# Patient Record
Sex: Female | Born: 1955 | Race: White | Hispanic: No | Marital: Single | Smoking: Current every day smoker
Health system: Southern US, Community
[De-identification: ages and names within clinical notes are randomized; demographics above are authoritative.]

## PROBLEM LIST (undated history)

## (undated) DIAGNOSIS — Z972 Presence of dental prosthetic device (complete) (partial): Secondary | ICD-10-CM

## (undated) DIAGNOSIS — I10 Essential (primary) hypertension: Secondary | ICD-10-CM

## (undated) DIAGNOSIS — M5430 Sciatica, unspecified side: Secondary | ICD-10-CM

## (undated) DIAGNOSIS — G8929 Other chronic pain: Secondary | ICD-10-CM

## (undated) DIAGNOSIS — F410 Panic disorder [episodic paroxysmal anxiety] without agoraphobia: Secondary | ICD-10-CM

## (undated) DIAGNOSIS — F32A Depression, unspecified: Secondary | ICD-10-CM

## (undated) DIAGNOSIS — M1990 Unspecified osteoarthritis, unspecified site: Secondary | ICD-10-CM

## (undated) DIAGNOSIS — J189 Pneumonia, unspecified organism: Secondary | ICD-10-CM

## (undated) DIAGNOSIS — F329 Major depressive disorder, single episode, unspecified: Secondary | ICD-10-CM

## (undated) DIAGNOSIS — G43909 Migraine, unspecified, not intractable, without status migrainosus: Secondary | ICD-10-CM

## (undated) HISTORY — PX: BACK SURGERY: SHX140

## (undated) HISTORY — PX: BLADDER REPAIR: SHX76

## (undated) HISTORY — PX: COLONOSCOPY: SHX174

## (undated) HISTORY — PX: ABDOMINAL HYSTERECTOMY: SHX81

---

## 1998-06-03 ENCOUNTER — Encounter: Admission: RE | Admit: 1998-06-03 | Discharge: 1998-06-03 | Payer: Self-pay | Admitting: Family Medicine

## 1998-06-24 ENCOUNTER — Encounter: Admission: RE | Admit: 1998-06-24 | Discharge: 1998-06-24 | Payer: Self-pay | Admitting: Family Medicine

## 1998-07-06 ENCOUNTER — Encounter: Admission: RE | Admit: 1998-07-06 | Discharge: 1998-07-06 | Payer: Self-pay | Admitting: Sports Medicine

## 1999-05-25 ENCOUNTER — Encounter: Admission: RE | Admit: 1999-05-25 | Discharge: 1999-08-23 | Payer: Self-pay | Admitting: Neurosurgery

## 2000-01-26 ENCOUNTER — Ambulatory Visit (HOSPITAL_COMMUNITY): Admission: RE | Admit: 2000-01-26 | Discharge: 2000-01-26 | Payer: Self-pay | Admitting: Neurosurgery

## 2000-01-26 ENCOUNTER — Encounter: Payer: Self-pay | Admitting: Neurosurgery

## 2000-02-10 ENCOUNTER — Ambulatory Visit (HOSPITAL_COMMUNITY): Admission: RE | Admit: 2000-02-10 | Discharge: 2000-02-10 | Payer: Self-pay | Admitting: Neurosurgery

## 2000-02-10 ENCOUNTER — Encounter: Payer: Self-pay | Admitting: Neurosurgery

## 2000-06-04 ENCOUNTER — Encounter: Admission: RE | Admit: 2000-06-04 | Discharge: 2000-06-04 | Payer: Self-pay | Admitting: Family Medicine

## 2000-06-20 ENCOUNTER — Encounter (INDEPENDENT_AMBULATORY_CARE_PROVIDER_SITE_OTHER): Payer: Self-pay | Admitting: *Deleted

## 2000-06-28 ENCOUNTER — Encounter: Admission: RE | Admit: 2000-06-28 | Discharge: 2000-06-28 | Payer: Self-pay | Admitting: Family Medicine

## 2000-06-29 ENCOUNTER — Encounter: Payer: Self-pay | Admitting: Sports Medicine

## 2000-06-29 ENCOUNTER — Encounter: Admission: RE | Admit: 2000-06-29 | Discharge: 2000-06-29 | Payer: Self-pay | Admitting: *Deleted

## 2000-07-09 ENCOUNTER — Encounter: Admission: RE | Admit: 2000-07-09 | Discharge: 2000-07-09 | Payer: Self-pay | Admitting: Family Medicine

## 2001-09-26 ENCOUNTER — Encounter: Admission: RE | Admit: 2001-09-26 | Discharge: 2001-09-26 | Payer: Self-pay | Admitting: Family Medicine

## 2001-11-05 ENCOUNTER — Encounter: Admission: RE | Admit: 2001-11-05 | Discharge: 2001-11-05 | Payer: Self-pay | Admitting: Family Medicine

## 2002-01-13 ENCOUNTER — Encounter: Admission: RE | Admit: 2002-01-13 | Discharge: 2002-01-13 | Payer: Self-pay | Admitting: Family Medicine

## 2002-03-10 ENCOUNTER — Encounter: Admission: RE | Admit: 2002-03-10 | Discharge: 2002-03-10 | Payer: Self-pay | Admitting: Family Medicine

## 2002-04-28 ENCOUNTER — Encounter: Admission: RE | Admit: 2002-04-28 | Discharge: 2002-04-28 | Payer: Self-pay | Admitting: Family Medicine

## 2005-05-01 ENCOUNTER — Emergency Department (HOSPITAL_COMMUNITY): Admission: EM | Admit: 2005-05-01 | Discharge: 2005-05-01 | Payer: Self-pay | Admitting: Emergency Medicine

## 2005-05-05 ENCOUNTER — Emergency Department (HOSPITAL_COMMUNITY): Admission: EM | Admit: 2005-05-05 | Discharge: 2005-05-05 | Payer: Self-pay | Admitting: Emergency Medicine

## 2006-01-03 ENCOUNTER — Emergency Department (HOSPITAL_COMMUNITY): Admission: EM | Admit: 2006-01-03 | Discharge: 2006-01-03 | Payer: Self-pay | Admitting: Emergency Medicine

## 2007-01-18 ENCOUNTER — Encounter (INDEPENDENT_AMBULATORY_CARE_PROVIDER_SITE_OTHER): Payer: Self-pay | Admitting: *Deleted

## 2007-04-17 ENCOUNTER — Emergency Department (HOSPITAL_COMMUNITY): Admission: EM | Admit: 2007-04-17 | Discharge: 2007-04-17 | Payer: Self-pay | Admitting: Emergency Medicine

## 2007-05-03 ENCOUNTER — Emergency Department (HOSPITAL_COMMUNITY): Admission: EM | Admit: 2007-05-03 | Discharge: 2007-05-03 | Payer: Self-pay | Admitting: *Deleted

## 2007-05-16 ENCOUNTER — Ambulatory Visit: Payer: Self-pay | Admitting: Family Medicine

## 2007-05-28 ENCOUNTER — Ambulatory Visit (HOSPITAL_COMMUNITY): Admission: RE | Admit: 2007-05-28 | Discharge: 2007-05-28 | Payer: Self-pay | Admitting: Family Medicine

## 2007-05-29 ENCOUNTER — Ambulatory Visit: Payer: Self-pay | Admitting: *Deleted

## 2007-07-04 ENCOUNTER — Emergency Department (HOSPITAL_COMMUNITY): Admission: EM | Admit: 2007-07-04 | Discharge: 2007-07-04 | Payer: Self-pay | Admitting: Emergency Medicine

## 2007-07-04 ENCOUNTER — Encounter (INDEPENDENT_AMBULATORY_CARE_PROVIDER_SITE_OTHER): Payer: Self-pay | Admitting: Nurse Practitioner

## 2007-08-02 ENCOUNTER — Emergency Department (HOSPITAL_COMMUNITY): Admission: EM | Admit: 2007-08-02 | Discharge: 2007-08-02 | Payer: Self-pay | Admitting: Emergency Medicine

## 2007-08-02 DIAGNOSIS — I10 Essential (primary) hypertension: Secondary | ICD-10-CM | POA: Insufficient documentation

## 2007-08-02 DIAGNOSIS — F17200 Nicotine dependence, unspecified, uncomplicated: Secondary | ICD-10-CM

## 2007-08-02 DIAGNOSIS — R209 Unspecified disturbances of skin sensation: Secondary | ICD-10-CM

## 2007-08-05 ENCOUNTER — Ambulatory Visit: Payer: Self-pay | Admitting: Internal Medicine

## 2007-08-05 DIAGNOSIS — M546 Pain in thoracic spine: Secondary | ICD-10-CM | POA: Insufficient documentation

## 2007-08-07 ENCOUNTER — Emergency Department (HOSPITAL_COMMUNITY): Admission: EM | Admit: 2007-08-07 | Discharge: 2007-08-07 | Payer: Self-pay | Admitting: Emergency Medicine

## 2007-08-08 ENCOUNTER — Ambulatory Visit (HOSPITAL_COMMUNITY): Admission: RE | Admit: 2007-08-08 | Discharge: 2007-08-08 | Payer: Self-pay | Admitting: Internal Medicine

## 2007-08-12 ENCOUNTER — Telehealth (INDEPENDENT_AMBULATORY_CARE_PROVIDER_SITE_OTHER): Payer: Self-pay | Admitting: Nurse Practitioner

## 2007-08-15 ENCOUNTER — Emergency Department (HOSPITAL_COMMUNITY): Admission: EM | Admit: 2007-08-15 | Discharge: 2007-08-15 | Payer: Self-pay | Admitting: Emergency Medicine

## 2007-08-19 ENCOUNTER — Ambulatory Visit: Payer: Self-pay | Admitting: Nurse Practitioner

## 2007-08-19 DIAGNOSIS — M7130 Other bursal cyst, unspecified site: Secondary | ICD-10-CM | POA: Insufficient documentation

## 2007-08-19 DIAGNOSIS — M5106 Intervertebral disc disorders with myelopathy, lumbar region: Secondary | ICD-10-CM

## 2007-09-03 ENCOUNTER — Emergency Department (HOSPITAL_COMMUNITY): Admission: EM | Admit: 2007-09-03 | Discharge: 2007-09-03 | Payer: Self-pay | Admitting: Emergency Medicine

## 2007-09-16 ENCOUNTER — Ambulatory Visit: Payer: Self-pay | Admitting: Nurse Practitioner

## 2007-09-16 DIAGNOSIS — K029 Dental caries, unspecified: Secondary | ICD-10-CM | POA: Insufficient documentation

## 2007-09-17 ENCOUNTER — Encounter (INDEPENDENT_AMBULATORY_CARE_PROVIDER_SITE_OTHER): Payer: Self-pay | Admitting: Nurse Practitioner

## 2007-09-30 ENCOUNTER — Telehealth (INDEPENDENT_AMBULATORY_CARE_PROVIDER_SITE_OTHER): Payer: Self-pay | Admitting: Nurse Practitioner

## 2007-10-02 ENCOUNTER — Encounter (INDEPENDENT_AMBULATORY_CARE_PROVIDER_SITE_OTHER): Payer: Self-pay | Admitting: Nurse Practitioner

## 2007-10-04 ENCOUNTER — Encounter (INDEPENDENT_AMBULATORY_CARE_PROVIDER_SITE_OTHER): Payer: Self-pay | Admitting: Nurse Practitioner

## 2007-10-24 ENCOUNTER — Telehealth (INDEPENDENT_AMBULATORY_CARE_PROVIDER_SITE_OTHER): Payer: Self-pay | Admitting: Nurse Practitioner

## 2007-11-22 ENCOUNTER — Telehealth (INDEPENDENT_AMBULATORY_CARE_PROVIDER_SITE_OTHER): Payer: Self-pay | Admitting: Nurse Practitioner

## 2007-12-08 ENCOUNTER — Emergency Department (HOSPITAL_COMMUNITY): Admission: EM | Admit: 2007-12-08 | Discharge: 2007-12-08 | Payer: Self-pay | Admitting: Emergency Medicine

## 2007-12-17 ENCOUNTER — Emergency Department (HOSPITAL_COMMUNITY): Admission: EM | Admit: 2007-12-17 | Discharge: 2007-12-17 | Payer: Self-pay | Admitting: Emergency Medicine

## 2007-12-25 ENCOUNTER — Telehealth (INDEPENDENT_AMBULATORY_CARE_PROVIDER_SITE_OTHER): Payer: Self-pay | Admitting: Nurse Practitioner

## 2008-01-23 ENCOUNTER — Ambulatory Visit: Payer: Self-pay | Admitting: Nurse Practitioner

## 2008-02-14 ENCOUNTER — Telehealth (INDEPENDENT_AMBULATORY_CARE_PROVIDER_SITE_OTHER): Payer: Self-pay | Admitting: Nurse Practitioner

## 2008-03-16 ENCOUNTER — Telehealth (INDEPENDENT_AMBULATORY_CARE_PROVIDER_SITE_OTHER): Payer: Self-pay | Admitting: Nurse Practitioner

## 2008-04-08 ENCOUNTER — Encounter (INDEPENDENT_AMBULATORY_CARE_PROVIDER_SITE_OTHER): Payer: Self-pay | Admitting: Nurse Practitioner

## 2008-04-14 ENCOUNTER — Telehealth (INDEPENDENT_AMBULATORY_CARE_PROVIDER_SITE_OTHER): Payer: Self-pay | Admitting: Nurse Practitioner

## 2008-05-14 ENCOUNTER — Telehealth (INDEPENDENT_AMBULATORY_CARE_PROVIDER_SITE_OTHER): Payer: Self-pay | Admitting: Nurse Practitioner

## 2008-06-01 ENCOUNTER — Emergency Department (HOSPITAL_COMMUNITY): Admission: EM | Admit: 2008-06-01 | Discharge: 2008-06-01 | Payer: Self-pay | Admitting: Emergency Medicine

## 2008-06-09 ENCOUNTER — Emergency Department (HOSPITAL_COMMUNITY): Admission: EM | Admit: 2008-06-09 | Discharge: 2008-06-09 | Payer: Self-pay | Admitting: Emergency Medicine

## 2008-06-11 ENCOUNTER — Telehealth (INDEPENDENT_AMBULATORY_CARE_PROVIDER_SITE_OTHER): Payer: Self-pay | Admitting: Nurse Practitioner

## 2008-06-18 ENCOUNTER — Ambulatory Visit (HOSPITAL_COMMUNITY): Admission: RE | Admit: 2008-06-18 | Discharge: 2008-06-19 | Payer: Self-pay | Admitting: Neurosurgery

## 2008-06-18 DIAGNOSIS — Z9889 Other specified postprocedural states: Secondary | ICD-10-CM

## 2008-07-18 ENCOUNTER — Encounter: Admission: RE | Admit: 2008-07-18 | Discharge: 2008-07-18 | Payer: Self-pay | Admitting: Neurosurgery

## 2008-07-25 ENCOUNTER — Emergency Department (HOSPITAL_COMMUNITY): Admission: EM | Admit: 2008-07-25 | Discharge: 2008-07-25 | Payer: Self-pay | Admitting: Emergency Medicine

## 2008-10-14 ENCOUNTER — Emergency Department (HOSPITAL_COMMUNITY): Admission: EM | Admit: 2008-10-14 | Discharge: 2008-10-14 | Payer: Self-pay | Admitting: Emergency Medicine

## 2008-11-16 IMAGING — CR DG FOOT COMPLETE 3+V*L*
3 series · 3 of 3 positions shown · non-contrast
Comparison: None

CLINICAL DATA: Fall with left foot pain.

LEFT FOOT - COMPLETE 3+ VIEW

[t foot ap left]
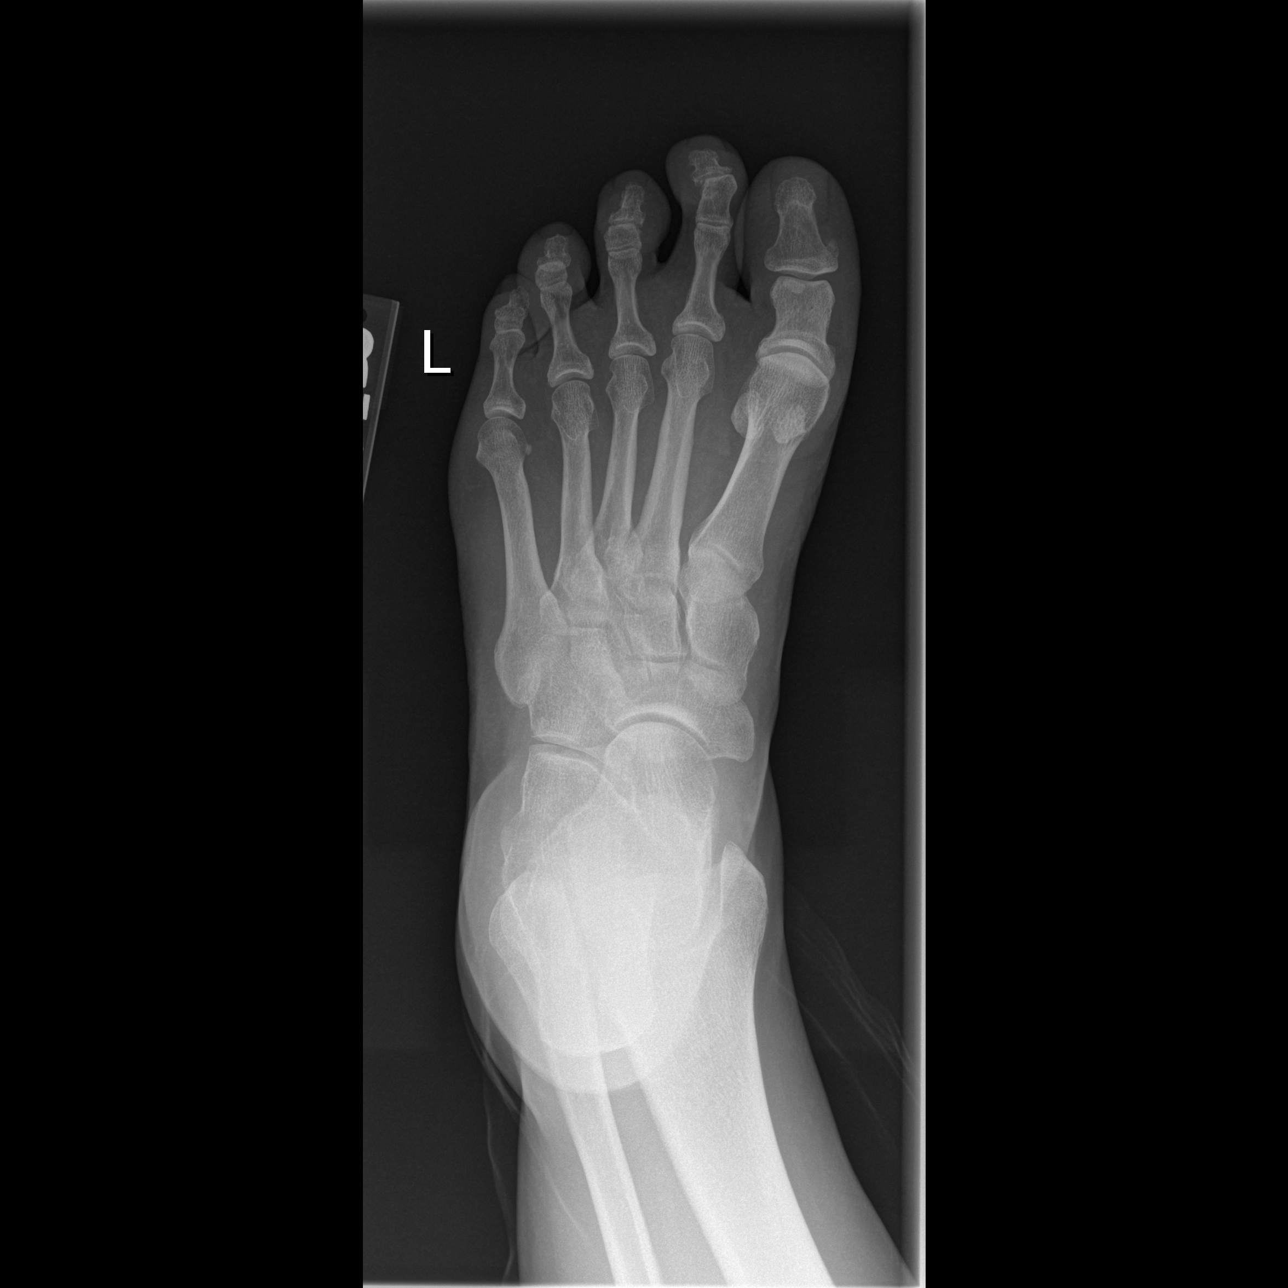

[t foot oblique left]
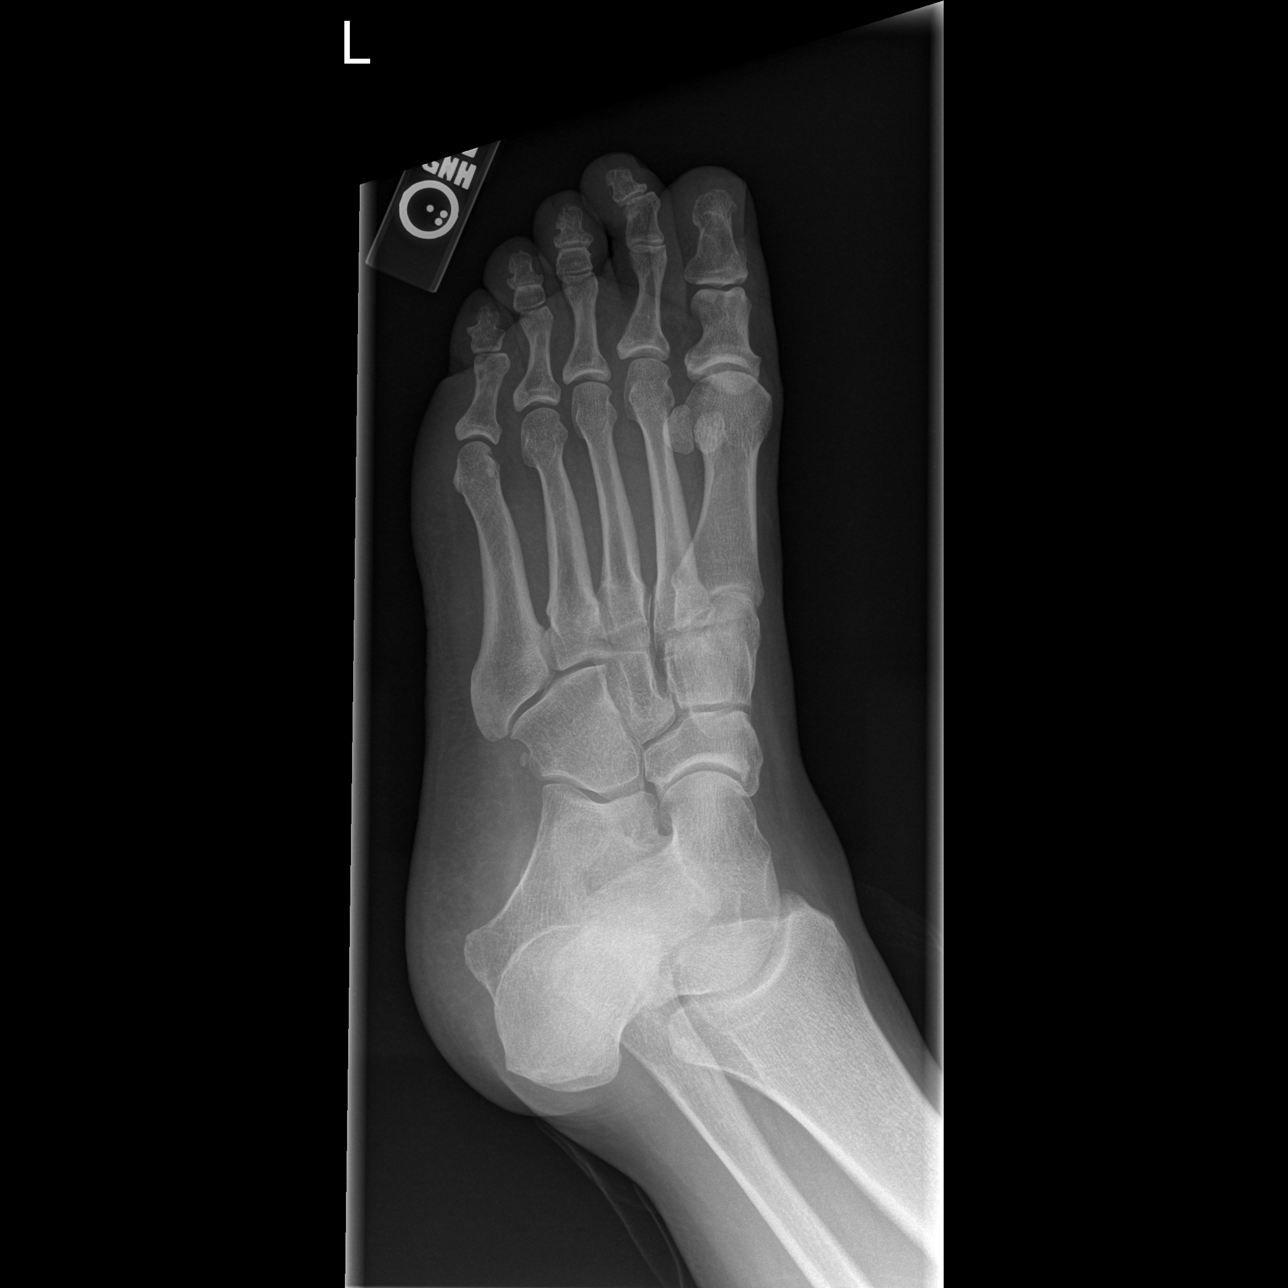

[t foot lat left]
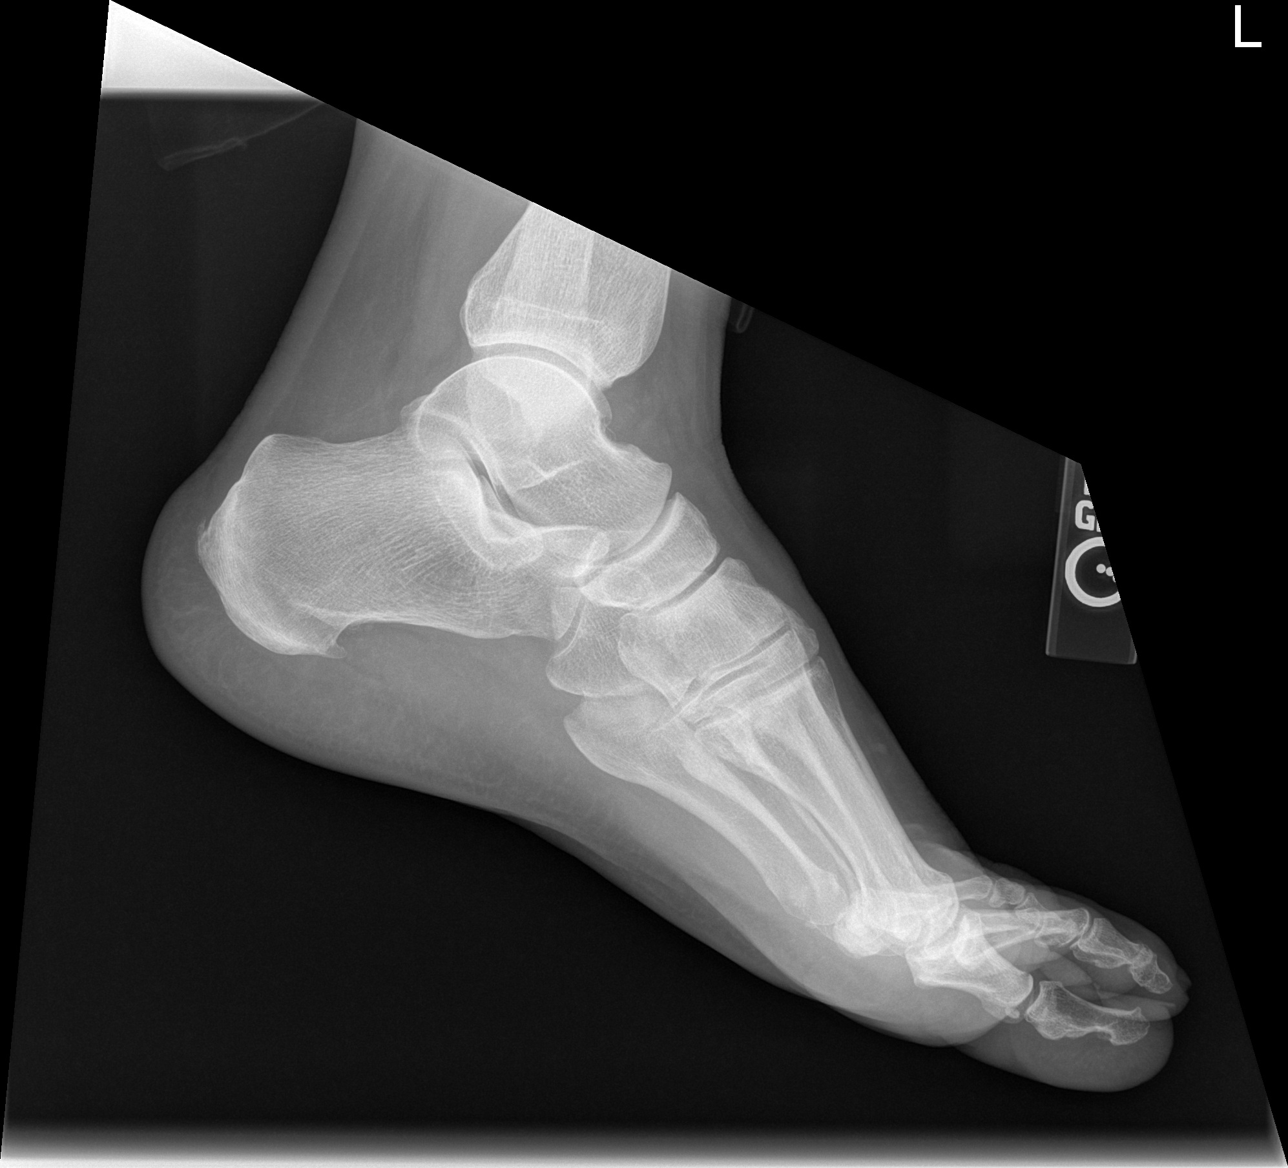

[3 of 3 positions shown; findings below may reference images not displayed]

FINDINGS: There is no evidence of acute bony abnormality.
No evidence of fracture, subluxation, or dislocation identified.
No focal bony lesions are noted.
A small calcaneal spur is present.
IMPRESSION: No evidence of acute bony abnormality.

## 2008-11-16 IMAGING — CR DG SCAPULA*R*
2 series · 2 of 2 positions shown · non-contrast
Comparison: 06/16/2008 chest x-ray

CLINICAL DATA: Fall with right shoulder pain.

RIGHT SCAPULA - two views

[w scapula ap/pa right *]
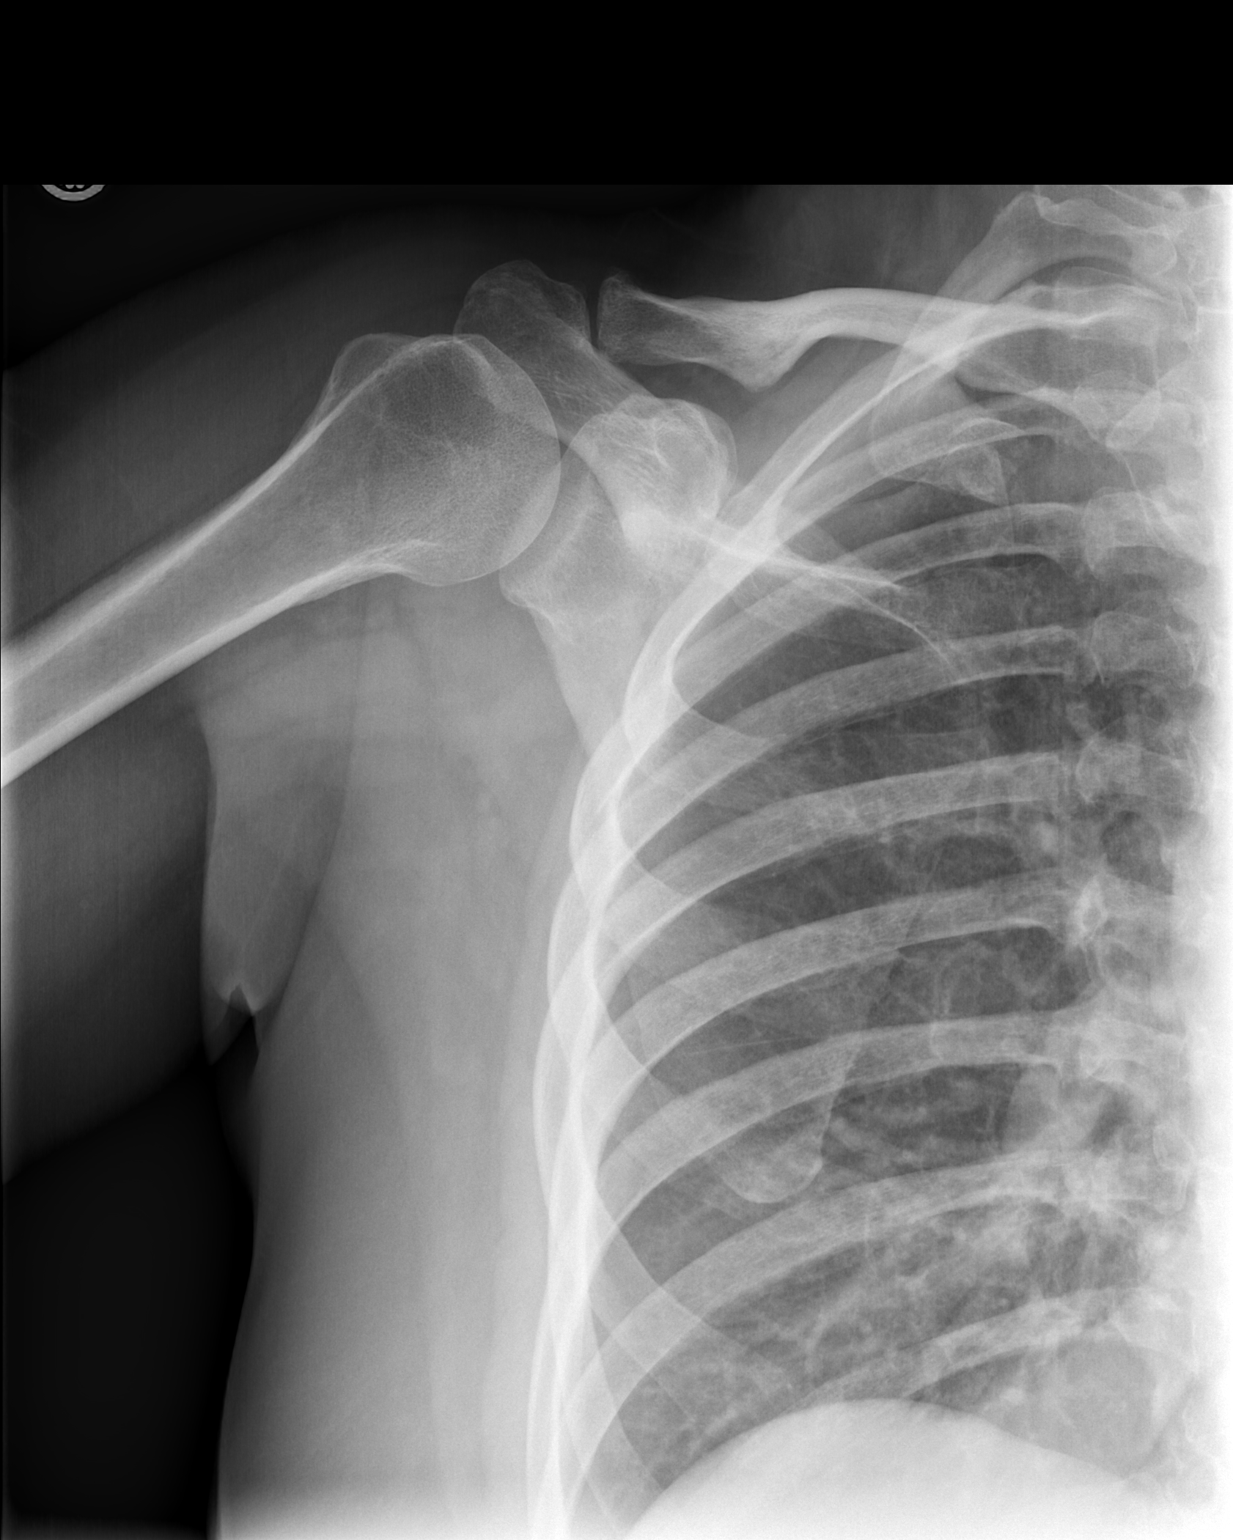

[w scapula lat right *]
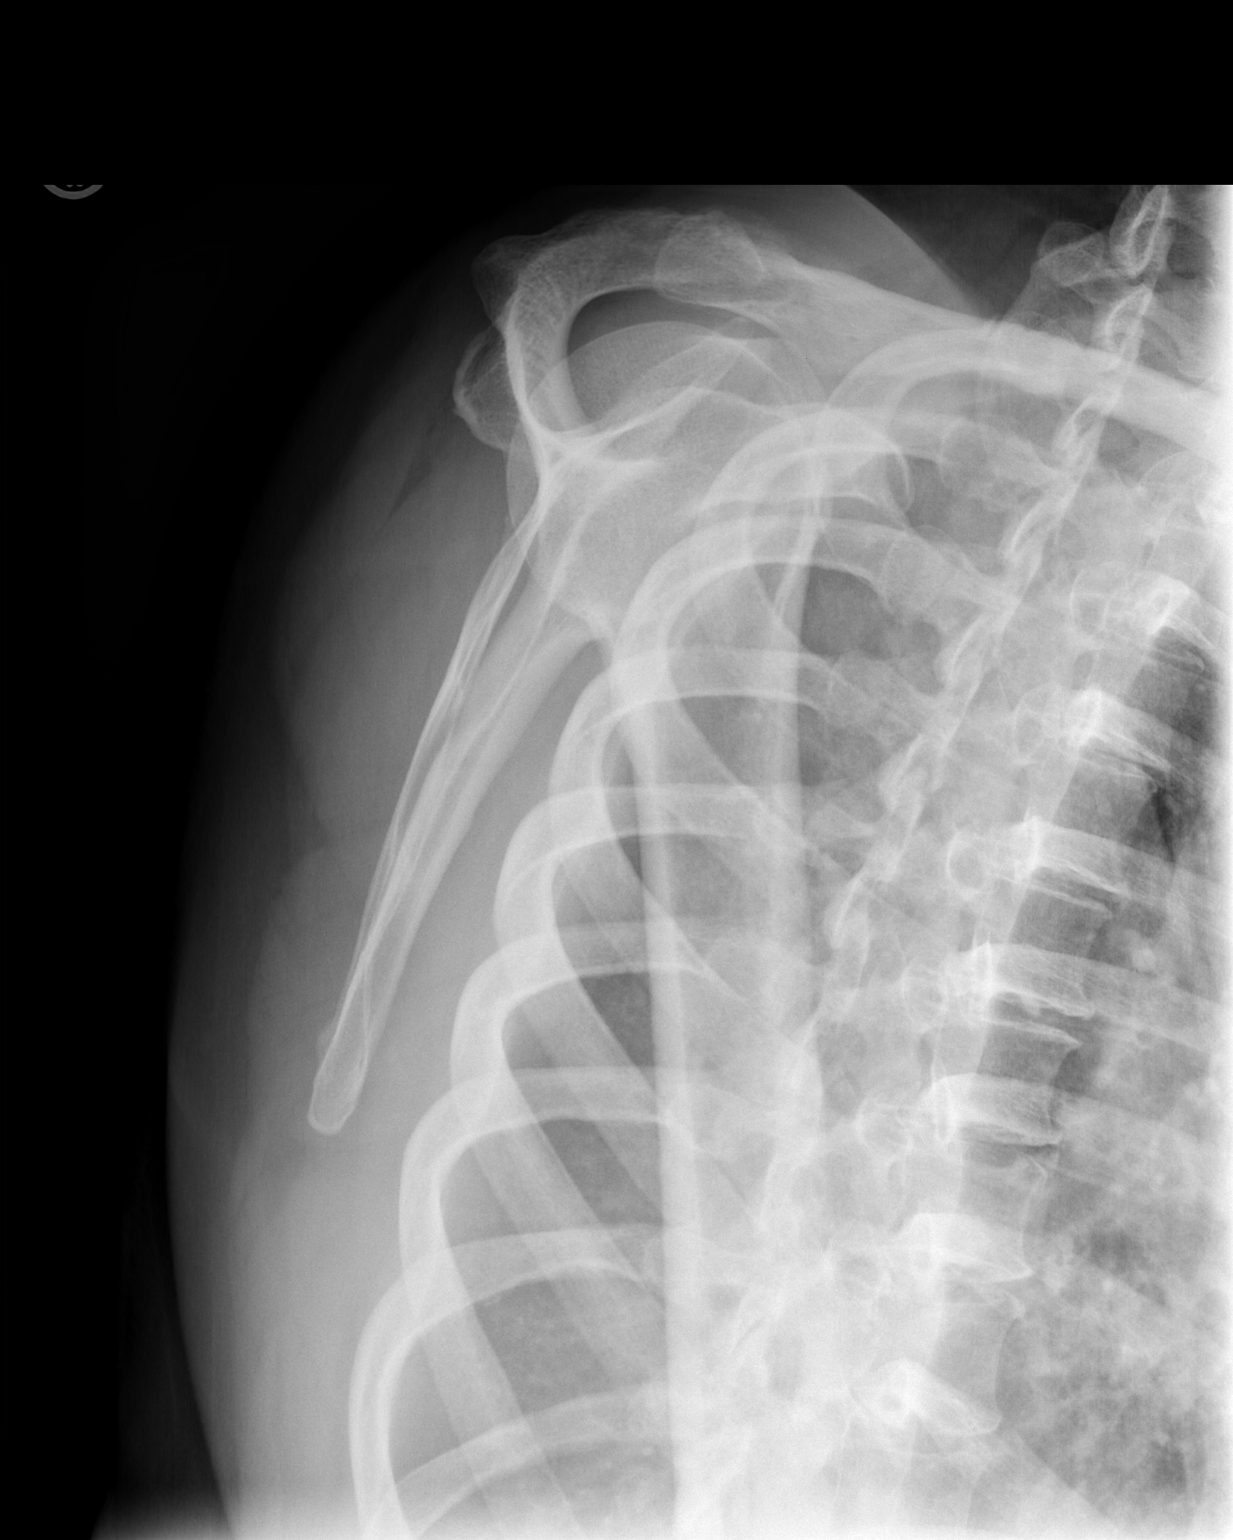

[2 of 2 positions shown; findings below may reference images not displayed]

FINDINGS: No evidence of acute fracture, subluxation or dislocation
identified.

No radio-opaque foreign bodies are present.

No focal bony lesions are noted.
IMPRESSION: No acute bony abnormalities.

## 2008-12-17 ENCOUNTER — Ambulatory Visit: Payer: Self-pay | Admitting: Nurse Practitioner

## 2008-12-21 ENCOUNTER — Telehealth (INDEPENDENT_AMBULATORY_CARE_PROVIDER_SITE_OTHER): Payer: Self-pay | Admitting: Nurse Practitioner

## 2008-12-21 LAB — CONVERTED CEMR LAB
Albumin: 4.6 g/dL (ref 3.5–5.2)
BUN: 11 mg/dL (ref 6–23)
CO2: 27 meq/L (ref 19–32)
Calcium: 9.7 mg/dL (ref 8.4–10.5)
Chloride: 106 meq/L (ref 96–112)
Eosinophils Absolute: 0.3 10*3/uL (ref 0.0–0.7)
Glucose, Bld: 94 mg/dL (ref 70–99)
Lymphocytes Relative: 40 % (ref 12–46)
Lymphs Abs: 2.4 10*3/uL (ref 0.7–4.0)
MCV: 99.8 fL (ref 78.0–100.0)
Monocytes Relative: 7 % (ref 3–12)
Neutro Abs: 2.9 10*3/uL (ref 1.7–7.7)
Neutrophils Relative %: 49 % (ref 43–77)
Potassium: 4.9 meq/L (ref 3.5–5.3)
RBC: 4.5 M/uL (ref 3.87–5.11)
WBC: 6 10*3/uL (ref 4.0–10.5)

## 2008-12-22 ENCOUNTER — Encounter (INDEPENDENT_AMBULATORY_CARE_PROVIDER_SITE_OTHER): Payer: Self-pay | Admitting: Nurse Practitioner

## 2008-12-23 ENCOUNTER — Encounter (INDEPENDENT_AMBULATORY_CARE_PROVIDER_SITE_OTHER): Payer: Self-pay | Admitting: Nurse Practitioner

## 2008-12-25 ENCOUNTER — Encounter (INDEPENDENT_AMBULATORY_CARE_PROVIDER_SITE_OTHER): Payer: Self-pay | Admitting: Nurse Practitioner

## 2009-01-05 ENCOUNTER — Encounter (INDEPENDENT_AMBULATORY_CARE_PROVIDER_SITE_OTHER): Payer: Self-pay | Admitting: Nurse Practitioner

## 2009-01-06 ENCOUNTER — Encounter (INDEPENDENT_AMBULATORY_CARE_PROVIDER_SITE_OTHER): Payer: Self-pay | Admitting: Nurse Practitioner

## 2009-01-11 ENCOUNTER — Ambulatory Visit: Payer: Self-pay | Admitting: Nurse Practitioner

## 2009-01-11 DIAGNOSIS — K589 Irritable bowel syndrome without diarrhea: Secondary | ICD-10-CM

## 2009-01-29 ENCOUNTER — Telehealth (INDEPENDENT_AMBULATORY_CARE_PROVIDER_SITE_OTHER): Payer: Self-pay | Admitting: Nurse Practitioner

## 2009-02-01 ENCOUNTER — Encounter (INDEPENDENT_AMBULATORY_CARE_PROVIDER_SITE_OTHER): Payer: Self-pay | Admitting: Nurse Practitioner

## 2009-02-08 ENCOUNTER — Telehealth (INDEPENDENT_AMBULATORY_CARE_PROVIDER_SITE_OTHER): Payer: Self-pay | Admitting: Nurse Practitioner

## 2009-03-10 ENCOUNTER — Telehealth (INDEPENDENT_AMBULATORY_CARE_PROVIDER_SITE_OTHER): Payer: Self-pay | Admitting: Nurse Practitioner

## 2009-03-12 ENCOUNTER — Encounter
Admission: RE | Admit: 2009-03-12 | Discharge: 2009-03-12 | Payer: Self-pay | Admitting: Physical Medicine and Rehabilitation

## 2009-03-17 ENCOUNTER — Encounter (INDEPENDENT_AMBULATORY_CARE_PROVIDER_SITE_OTHER): Payer: Self-pay | Admitting: Nurse Practitioner

## 2009-03-22 ENCOUNTER — Telehealth (INDEPENDENT_AMBULATORY_CARE_PROVIDER_SITE_OTHER): Payer: Self-pay | Admitting: Nurse Practitioner

## 2009-04-06 ENCOUNTER — Telehealth (INDEPENDENT_AMBULATORY_CARE_PROVIDER_SITE_OTHER): Payer: Self-pay | Admitting: Nurse Practitioner

## 2009-05-12 ENCOUNTER — Emergency Department (HOSPITAL_COMMUNITY): Admission: EM | Admit: 2009-05-12 | Discharge: 2009-05-12 | Payer: Self-pay | Admitting: Family Medicine

## 2009-05-24 ENCOUNTER — Emergency Department (HOSPITAL_COMMUNITY): Admission: EM | Admit: 2009-05-24 | Discharge: 2009-05-24 | Payer: Self-pay | Admitting: Emergency Medicine

## 2009-06-17 ENCOUNTER — Ambulatory Visit: Payer: Self-pay | Admitting: Nurse Practitioner

## 2009-06-24 ENCOUNTER — Telehealth (INDEPENDENT_AMBULATORY_CARE_PROVIDER_SITE_OTHER): Payer: Self-pay | Admitting: Nurse Practitioner

## 2009-07-06 ENCOUNTER — Telehealth (INDEPENDENT_AMBULATORY_CARE_PROVIDER_SITE_OTHER): Payer: Self-pay | Admitting: Nurse Practitioner

## 2009-07-13 ENCOUNTER — Telehealth (INDEPENDENT_AMBULATORY_CARE_PROVIDER_SITE_OTHER): Payer: Self-pay | Admitting: Nurse Practitioner

## 2009-07-27 ENCOUNTER — Encounter (INDEPENDENT_AMBULATORY_CARE_PROVIDER_SITE_OTHER): Payer: Self-pay | Admitting: Nurse Practitioner

## 2009-08-09 ENCOUNTER — Encounter (INDEPENDENT_AMBULATORY_CARE_PROVIDER_SITE_OTHER): Payer: Self-pay | Admitting: Nurse Practitioner

## 2009-08-13 ENCOUNTER — Encounter (INDEPENDENT_AMBULATORY_CARE_PROVIDER_SITE_OTHER): Payer: Self-pay | Admitting: Nurse Practitioner

## 2009-08-17 ENCOUNTER — Telehealth (INDEPENDENT_AMBULATORY_CARE_PROVIDER_SITE_OTHER): Payer: Self-pay | Admitting: Nurse Practitioner

## 2009-08-17 ENCOUNTER — Ambulatory Visit: Payer: Self-pay | Admitting: Pain Medicine

## 2009-08-18 ENCOUNTER — Encounter (INDEPENDENT_AMBULATORY_CARE_PROVIDER_SITE_OTHER): Payer: Self-pay | Admitting: Nurse Practitioner

## 2009-09-11 ENCOUNTER — Emergency Department (HOSPITAL_COMMUNITY): Admission: EM | Admit: 2009-09-11 | Discharge: 2009-09-11 | Payer: Self-pay | Admitting: Emergency Medicine

## 2009-09-14 ENCOUNTER — Ambulatory Visit: Payer: Self-pay | Admitting: Nurse Practitioner

## 2009-09-14 DIAGNOSIS — R1031 Right lower quadrant pain: Secondary | ICD-10-CM

## 2009-09-14 LAB — CONVERTED CEMR LAB
Chloride: 103 meq/L (ref 96–112)
Potassium: 4.3 meq/L (ref 3.5–5.3)
Sodium: 139 meq/L (ref 135–145)

## 2009-09-15 ENCOUNTER — Encounter (INDEPENDENT_AMBULATORY_CARE_PROVIDER_SITE_OTHER): Payer: Self-pay | Admitting: Nurse Practitioner

## 2009-09-17 ENCOUNTER — Telehealth (INDEPENDENT_AMBULATORY_CARE_PROVIDER_SITE_OTHER): Payer: Self-pay | Admitting: Nurse Practitioner

## 2009-09-20 ENCOUNTER — Ambulatory Visit (HOSPITAL_COMMUNITY): Admission: RE | Admit: 2009-09-20 | Discharge: 2009-09-20 | Payer: Self-pay | Admitting: Internal Medicine

## 2009-09-20 ENCOUNTER — Encounter (INDEPENDENT_AMBULATORY_CARE_PROVIDER_SITE_OTHER): Payer: Self-pay | Admitting: Nurse Practitioner

## 2009-09-20 ENCOUNTER — Emergency Department (HOSPITAL_COMMUNITY): Admission: EM | Admit: 2009-09-20 | Discharge: 2009-09-20 | Payer: Self-pay | Admitting: Emergency Medicine

## 2009-09-20 ENCOUNTER — Telehealth (INDEPENDENT_AMBULATORY_CARE_PROVIDER_SITE_OTHER): Payer: Self-pay | Admitting: Nurse Practitioner

## 2009-09-20 DIAGNOSIS — D259 Leiomyoma of uterus, unspecified: Secondary | ICD-10-CM | POA: Insufficient documentation

## 2009-09-22 ENCOUNTER — Ambulatory Visit: Payer: Self-pay | Admitting: Pain Medicine

## 2009-09-27 ENCOUNTER — Ambulatory Visit: Payer: Self-pay | Admitting: Nurse Practitioner

## 2009-09-29 ENCOUNTER — Encounter (INDEPENDENT_AMBULATORY_CARE_PROVIDER_SITE_OTHER): Payer: Self-pay | Admitting: Nurse Practitioner

## 2009-10-26 ENCOUNTER — Ambulatory Visit: Payer: Self-pay | Admitting: Pain Medicine

## 2009-10-27 ENCOUNTER — Telehealth (INDEPENDENT_AMBULATORY_CARE_PROVIDER_SITE_OTHER): Payer: Self-pay | Admitting: Nurse Practitioner

## 2009-11-01 ENCOUNTER — Ambulatory Visit: Payer: Self-pay | Admitting: Pain Medicine

## 2009-11-19 ENCOUNTER — Emergency Department (HOSPITAL_COMMUNITY): Admission: EM | Admit: 2009-11-19 | Discharge: 2009-11-19 | Payer: Self-pay | Admitting: Emergency Medicine

## 2009-12-03 ENCOUNTER — Emergency Department (HOSPITAL_COMMUNITY): Admission: EM | Admit: 2009-12-03 | Discharge: 2009-12-03 | Payer: Self-pay | Admitting: Emergency Medicine

## 2009-12-09 ENCOUNTER — Ambulatory Visit: Payer: Self-pay | Admitting: Pain Medicine

## 2009-12-20 ENCOUNTER — Emergency Department (HOSPITAL_COMMUNITY): Admission: EM | Admit: 2009-12-20 | Discharge: 2009-12-20 | Payer: Self-pay | Admitting: Emergency Medicine

## 2009-12-23 ENCOUNTER — Ambulatory Visit: Payer: Self-pay | Admitting: Pain Medicine

## 2009-12-28 ENCOUNTER — Emergency Department (HOSPITAL_COMMUNITY): Admission: EM | Admit: 2009-12-28 | Discharge: 2009-12-28 | Payer: Self-pay | Admitting: Emergency Medicine

## 2010-01-10 ENCOUNTER — Ambulatory Visit: Payer: Self-pay | Admitting: Pain Medicine

## 2010-01-12 ENCOUNTER — Emergency Department (HOSPITAL_COMMUNITY): Admission: EM | Admit: 2010-01-12 | Discharge: 2010-01-12 | Payer: Self-pay | Admitting: Emergency Medicine

## 2010-01-14 ENCOUNTER — Emergency Department (HOSPITAL_COMMUNITY): Admission: EM | Admit: 2010-01-14 | Discharge: 2010-01-14 | Payer: Self-pay | Admitting: Emergency Medicine

## 2010-01-18 ENCOUNTER — Telehealth (INDEPENDENT_AMBULATORY_CARE_PROVIDER_SITE_OTHER): Payer: Self-pay | Admitting: Nurse Practitioner

## 2010-01-19 ENCOUNTER — Telehealth (INDEPENDENT_AMBULATORY_CARE_PROVIDER_SITE_OTHER): Payer: Self-pay | Admitting: Nurse Practitioner

## 2010-02-02 ENCOUNTER — Ambulatory Visit: Payer: Self-pay | Admitting: Pain Medicine

## 2010-02-15 ENCOUNTER — Encounter (INDEPENDENT_AMBULATORY_CARE_PROVIDER_SITE_OTHER): Payer: Self-pay | Admitting: Obstetrics and Gynecology

## 2010-02-15 ENCOUNTER — Ambulatory Visit (HOSPITAL_COMMUNITY): Admission: RE | Admit: 2010-02-15 | Discharge: 2010-02-16 | Payer: Self-pay | Admitting: Obstetrics and Gynecology

## 2010-02-21 ENCOUNTER — Inpatient Hospital Stay (HOSPITAL_COMMUNITY): Admission: AD | Admit: 2010-02-21 | Discharge: 2010-02-21 | Payer: Self-pay | Admitting: Obstetrics and Gynecology

## 2010-03-03 ENCOUNTER — Ambulatory Visit: Payer: Self-pay | Admitting: Pain Medicine

## 2010-03-28 ENCOUNTER — Ambulatory Visit: Payer: Self-pay | Admitting: Pain Medicine

## 2010-04-26 ENCOUNTER — Ambulatory Visit: Payer: Self-pay | Admitting: Pain Medicine

## 2010-05-23 ENCOUNTER — Emergency Department (HOSPITAL_COMMUNITY): Admission: EM | Admit: 2010-05-23 | Discharge: 2010-05-23 | Payer: Self-pay | Admitting: Emergency Medicine

## 2010-05-31 ENCOUNTER — Telehealth (INDEPENDENT_AMBULATORY_CARE_PROVIDER_SITE_OTHER): Payer: Self-pay | Admitting: Nurse Practitioner

## 2010-05-31 ENCOUNTER — Ambulatory Visit: Payer: Self-pay | Admitting: Pain Medicine

## 2010-05-31 ENCOUNTER — Encounter (INDEPENDENT_AMBULATORY_CARE_PROVIDER_SITE_OTHER): Payer: Self-pay | Admitting: Nurse Practitioner

## 2010-06-20 DIAGNOSIS — E8941 Symptomatic postprocedural ovarian failure: Secondary | ICD-10-CM

## 2010-06-29 ENCOUNTER — Ambulatory Visit: Payer: Self-pay | Admitting: Pain Medicine

## 2010-06-29 ENCOUNTER — Emergency Department: Payer: Self-pay | Admitting: Emergency Medicine

## 2010-07-25 ENCOUNTER — Emergency Department (HOSPITAL_COMMUNITY): Admission: EM | Admit: 2010-07-25 | Discharge: 2010-07-25 | Payer: Self-pay | Admitting: Emergency Medicine

## 2010-07-29 ENCOUNTER — Emergency Department (HOSPITAL_COMMUNITY): Admission: EM | Admit: 2010-07-29 | Discharge: 2010-07-29 | Payer: Self-pay | Admitting: Emergency Medicine

## 2010-08-02 ENCOUNTER — Ambulatory Visit: Payer: Self-pay | Admitting: Pain Medicine

## 2010-08-08 ENCOUNTER — Telehealth (INDEPENDENT_AMBULATORY_CARE_PROVIDER_SITE_OTHER): Payer: Self-pay | Admitting: Nurse Practitioner

## 2010-08-30 ENCOUNTER — Ambulatory Visit: Payer: Self-pay | Admitting: Nurse Practitioner

## 2010-08-30 DIAGNOSIS — E669 Obesity, unspecified: Secondary | ICD-10-CM

## 2010-08-30 DIAGNOSIS — F329 Major depressive disorder, single episode, unspecified: Secondary | ICD-10-CM

## 2010-08-30 LAB — CONVERTED CEMR LAB
AST: 18 units/L (ref 0–37)
Albumin: 4.2 g/dL (ref 3.5–5.2)
BUN: 9 mg/dL (ref 6–23)
Basophils Relative: 0 % (ref 0–1)
Bilirubin Urine: NEGATIVE
Calcium: 9 mg/dL (ref 8.4–10.5)
Chloride: 101 meq/L (ref 96–112)
Creatinine, Ser: 0.8 mg/dL (ref 0.40–1.20)
Eosinophils Absolute: 0.3 10*3/uL (ref 0.0–0.7)
Glucose, Bld: 82 mg/dL (ref 70–99)
HDL: 43 mg/dL (ref >39)
Hemoglobin: 13.3 g/dL (ref 12.0–15.0)
Ketones, urine, test strip: NEGATIVE
Lymphs Abs: 2.9 10*3/uL (ref 0.7–4.0)
MCHC: 33 g/dL (ref 30.0–36.0)
MCV: 101.5 fL — ABNORMAL HIGH (ref 78.0–100.0)
Monocytes Absolute: 0.5 10*3/uL (ref 0.1–1.0)
Monocytes Relative: 7 % (ref 3–12)
Neutro Abs: 3 10*3/uL (ref 1.7–7.7)
Protein, U semiquant: NEGATIVE
RBC: 3.97 M/uL (ref 3.87–5.11)
TSH: 1.677 microintl units/mL (ref 0.350–4.500)
Total CHOL/HDL Ratio: 4.4
Triglycerides: 154 mg/dL — ABNORMAL HIGH (ref <150)
Urobilinogen, UA: 0.2
WBC: 6.6 10*3/uL (ref 4.0–10.5)

## 2010-09-01 ENCOUNTER — Encounter (INDEPENDENT_AMBULATORY_CARE_PROVIDER_SITE_OTHER): Payer: Self-pay | Admitting: Nurse Practitioner

## 2010-09-06 ENCOUNTER — Ambulatory Visit: Payer: Self-pay | Admitting: Pain Medicine

## 2010-09-06 ENCOUNTER — Encounter (INDEPENDENT_AMBULATORY_CARE_PROVIDER_SITE_OTHER): Payer: Self-pay | Admitting: Nurse Practitioner

## 2010-09-08 ENCOUNTER — Encounter: Admission: RE | Admit: 2010-09-08 | Discharge: 2010-09-08 | Payer: Self-pay | Admitting: Internal Medicine

## 2010-09-15 ENCOUNTER — Emergency Department (HOSPITAL_COMMUNITY): Admission: EM | Admit: 2010-09-15 | Discharge: 2010-09-16 | Payer: Self-pay | Admitting: Emergency Medicine

## 2010-10-04 ENCOUNTER — Ambulatory Visit: Payer: Self-pay | Admitting: Pain Medicine

## 2010-11-01 ENCOUNTER — Ambulatory Visit: Payer: Self-pay | Admitting: Pain Medicine

## 2010-12-10 ENCOUNTER — Encounter: Payer: Self-pay | Admitting: Internal Medicine

## 2010-12-12 ENCOUNTER — Emergency Department (HOSPITAL_COMMUNITY)
Admission: EM | Admit: 2010-12-12 | Discharge: 2010-12-12 | Payer: Self-pay | Source: Home / Self Care | Admitting: Emergency Medicine

## 2010-12-22 ENCOUNTER — Emergency Department (HOSPITAL_COMMUNITY)
Admission: EM | Admit: 2010-12-22 | Discharge: 2010-12-22 | Disposition: A | Discharge status: Home / Self Care | Payer: Medicaid Other | Attending: Emergency Medicine | Admitting: Emergency Medicine

## 2010-12-22 DIAGNOSIS — M545 Low back pain, unspecified: Secondary | ICD-10-CM | POA: Insufficient documentation

## 2010-12-22 DIAGNOSIS — I10 Essential (primary) hypertension: Secondary | ICD-10-CM | POA: Insufficient documentation

## 2010-12-22 DIAGNOSIS — M2560 Stiffness of unspecified joint, not elsewhere classified: Secondary | ICD-10-CM | POA: Insufficient documentation

## 2010-12-22 DIAGNOSIS — F17200 Nicotine dependence, unspecified, uncomplicated: Secondary | ICD-10-CM | POA: Insufficient documentation

## 2010-12-22 DIAGNOSIS — M533 Sacrococcygeal disorders, not elsewhere classified: Secondary | ICD-10-CM | POA: Insufficient documentation

## 2010-12-22 LAB — URINALYSIS, ROUTINE W REFLEX MICROSCOPIC
Hgb urine dipstick: NEGATIVE
Leukocytes, UA: NEGATIVE
Specific Gravity, Urine: 1.031 — ABNORMAL HIGH (ref 1.005–1.030)
Urine Glucose, Fasting: >1000 mg/dL — AB
pH: 5 (ref 5.0–8.0)

## 2010-12-22 LAB — URINE MICROSCOPIC-ADD ON

## 2010-12-22 NOTE — Letter (Signed)
Summary: MED/SOLUTIONS  MED/SOLUTIONS   Imported By: Arta Bruce 09/14/2010 15:00:21  _____________________________________________________________________  External Attachment:    Type:   Image     Comment:   External Document

## 2010-12-22 NOTE — Letter (Signed)
Summary: REFERRAL//GYNECOLOGIC//APPT DATE & TIME  REFERRAL//GYNECOLOGIC//APPT DATE & TIME   Imported By: Arta Bruce 11/22/2009 12:55:27  _____________________________________________________________________  External Attachment:    Type:   Image     Comment:   External Document

## 2010-12-22 NOTE — Assessment & Plan Note (Signed)
Summary: HTN   Vital Signs:  Patient profile:   55 year old female Menstrual status:  postmenopausal Height:      64 inches Weight:      209.9 pounds BMI:     36.16 Temp:     97.0 degrees F oral Pulse rate:   72 / minute Pulse rhythm:   regular Resp:     16 per minute BP sitting:   112 / 60  (left arm) Cuff size:   regular  Vitals Entered By: Levon Hedger (August 30, 2010 11:18 AM)  Nutrition Counseling: Patient's BMI is greater than 25 and therefore counseled on weight management options. CC: pt had hysterectomy 2 months ago and is concerned that she is gaining alot of weight .Marland Kitchen.feeling very fatigued Is Patient Diabetic? No Pain Assessment Patient in pain? yes     Location: back Onset of pain  Chronic  Does patient need assistance? Functional Status Self care Ambulation Normal Comments did not bring medication.   Primary Care Provider:  Daphine Deutscher  CC:  pt had hysterectomy 2 months ago and is concerned that she is gaining alot of weight .Marland Kitchen.feeling very fatigued.  History of Present Illness:   Pt into the office originally scheduled for a CPE however pt got a hysterectomy 2 months ago by Dr. Su Hilt.   Pt also has a bladder tacking.  This has dramatically helped the urinary frequency and urge incontinence issues.  Psychiatrist - Pt does still gets her medications prescribed by that provider. Pt was previously seeing a therapist - Pt started seeing this therapist following the death of her brother and sister.    Pain Management - Rx by pain managament is ongoing.  Ever since her hysterectomy pt has been experiencing the following: +fatigue +mood swings +weight gain +night sweats  Pt has never has a colonscopy.  Hx of IBS no family history of colon cancer   Habits & Providers  Alcohol-Tobacco-Diet     Alcohol drinks/day: 0     Tobacco Status: current     Tobacco Counseling: to quit use of tobacco products     Cigarette Packs/Day:  1/2  Exercise-Depression-Behavior     Does Patient Exercise: no     Have you felt down or hopeless? yes     Have you felt little pleasure in things? yes     Depression Counseling: further diagnostic testing and/or other treatment is indicated     Drug Use: no     Seat Belt Use: 100     Sun Exposure: rarely  Comments: pt has a Mental health that is going ongoing. PHQ-9 score = 10  Allergies (verified): No Known Drug Allergies  Past History:  Past Surgical History: ultrasound ruq-nml - 07/03/2000 hysterectomy 06/2010 (Dr. Su Hilt)  Review of Systems General:  Complains of fatigue. CV:  Complains of fatigue. GI:  Denies abdominal pain, nausea, and vomiting. GU:  Denies discharge and urinary frequency. Psych:  Complains of depression.  Physical Exam  General:  alert.   Head:  normocephalic.   Lungs:  normal breath sounds.   Heart:  normal rate and regular rhythm.   Neurologic:  alert & oriented X3.     Impression & Recommendations:  Problem # 1:  HYPERTENSION (ICD-401.9)  Her updated medication list for this problem includes:    Lisinopril 40 Mg Tabs (Lisinopril) ..... One tablet by mouth daily for blood pressure  Orders: T-Comprehensive Metabolic Panel [phone]) T-Lipid Profile ([id number]) UA Dipstick w/o Micro (manual) (41660) T-Urine Microalbumin w/creat.  ratio [phone])  Problem # 2:  OBESITY (ICD-278.00) congrats to pt Orders: T-Comprehensive Metabolic Panel ([id number]) T-Lipid Profile (52841-32440) UA Dipstick w/o Micro (manual) (10272) T-Urine Microalbumin w/creat. ratio [phone])  Problem # 3:  UNSPECIFIED BREAST SCREENING (ICD-V76.10) self breast exam placcard given mammogram scheduled Orders: Mammogram (Screening) (Mammo)  Problem # 4:  TOBACCO USER (ICD-305.1) advised cessation  Problem # 5:  IRRITABLE BOWEL SYNDROME (ICD-564.1) will refill meds  Problem # 6:  NEED PROPHYLACTIC VACCINATION&INOCULATION FLU  (ICD-V04.81) given today  Complete Medication List: 1)  Cymbalta 60 Mg Cpep (Duloxetine hcl) .Marland Kitchen.. 1 tablet by mouth two times a day **rx by psych** 2)  Lisinopril 40 Mg Tabs (Lisinopril) .... One tablet by mouth daily for blood pressure 3)  Dicyclomine Hcl 10 Mg Caps (Dicyclomine hcl) .Marland Kitchen.. 1 tablet by mouth three times daily with meals  Other Orders: T-TSH (56387-56433) T-CBC w/Diff [phone]) Flu Vaccine 45yrs + [phone]) Admin 1st Vaccine (60109) Admin 1st Vaccine [facility]) [phone])  Patient Instructions: 1)  You have been given the flu vaccine today 2)  You have been scheduled for a mammogram 3)  You will be notified of any abnormal labs. 4)  Medication refills have been sent to the pharmacy. 5)  Remember you are indicated to have a colonscopy since you are over 50. 6)  Contact Dr. Su Hilt regarding hormone replacement for the symptoms related to decrease in hormones following your hysterectomy. Prescriptions: DICYCLOMINE HCL 10 MG CAPS (DICYCLOMINE HCL) 1 tablet by mouth three times daily with meals  #90 x 5   Entered and Authorized by:   Lehman Prom FNP   Signed by:   Lehman Prom FNP on 08/30/2010   Method used:   Electronically to        [facility] [phone]* (retail)       [address]       [address]       [place], [place]  [address]       Ph: [phone]       Fax: [phone]   RxID:   [id number] LISINOPRIL 40 MG TABS (LISINOPRIL) One tablet by mouth daily for blood pressure  #30 x 11   Entered and Authorized by:   Lehman Prom FNP   Signed by:   Lehman Prom FNP on 08/30/2010   Method used:   Electronically to        [facility] [address] [phone]* (retail)       [address]       [address]       [place], [place]  [address]       Ph: [phone]       Fax: [phone]   RxID:   [id number]   Laboratory Results   Urine Tests  Date/Time Received: August 30, 2010 11:58 AM   Routine Urinalysis   Color: lt.  yellow Glucose: negative   (Normal Range: Negative) Bilirubin: negative   (Normal Range: Negative) Ketone: negative   (Normal Range: Negative) Spec. Gravity: <1.005   (Normal Range: 1.003-1.035) Blood: negative   (Normal Range: Negative) pH: 5.5   (Normal Range: 5.0-8.0) Protein: negative   (Normal Range: Negative) Urobilinogen: 0.2   (Normal Range: 0-1) Nitrite: negative   (Normal Range: Negative) Leukocyte Esterace: negative   (Normal Range: Negative)        Influenza Vaccine    Vaccine Type: Fluvax 3+    Site: left deltoid    Mfr: GlaxoSmithKline    Dose: 0.5 ml  Route: IM    Given by: Levon Hedger    Exp. Date: 04/2011    Lot #: [id number]    VIS given: 06/14/10 version given August 30, 2010.  Flu Vaccine Consent Questions    Do you have a history of severe allergic reactions to this vaccine? no    Any prior history of allergic reactions to egg and/or gelatin? no    Do you have a sensitivity to the preservative Thimersol? no    Do you have a past history of Guillan-Barre Syndrome? no    Do you currently have an acute febrile illness? no    Have you ever had a severe reaction to latex? no    Vaccine information given and explained to patient? yes    Are you currently pregnant? no    ndc  [phone]  Prevention & Chronic Care Immunizations   Influenza vaccine: Fluvax 3+  (08/30/2010)    Tetanus booster: Not documented    Pneumococcal vaccine: Not documented  Colorectal Screening   Hemoccult: Done.  (06/20/2000)    Colonoscopy: Not documented   Colonoscopy action/deferral: Deferred  (08/30/2010)  Other Screening   Pap smear: Done.  (06/20/2000)   Pap smear action/deferral: Not indicated S/P hysterectomy  (08/30/2010)    Mammogram: Not documented   Smoking status: current  (08/30/2010)   Smoking cessation counseling: yes  (12/17/2008)  Lipids   Total Cholesterol: Not documented   LDL: Not documented   LDL Direct: Not documented   HDL: Not  documented   Triglycerides: Not documented  Hypertension   Last Blood Pressure: 112 / 60  (08/30/2010)   Serum creatinine: 0.68  (09/14/2009)   Serum potassium 4.3  (09/14/2009) CMP ordered   Self-Management Support :    Hypertension self-management support: Not documented

## 2010-12-22 NOTE — Progress Notes (Signed)
Summary: Query:  Refill lisinopril?  Phone Note Outgoing Call   Summary of Call: Do you want her lisinopril refilled?  Last seen 09/2009, but has appt. 08/30/10.   Initial call taken by: Dutch Quint RN,  August 08, 2010 12:31 PM  Follow-up for Phone Call        ok to refill for 30 days.  will give more when she returns to the office   Follow-up by: Lehman Prom FNP,  August 08, 2010 1:16 PM  Additional Follow-up for Phone Call Additional follow up Details #1::        Pt. advised of refill and confirmed appt.  Dutch Quint RN  August 08, 2010 2:21 PM

## 2010-12-22 NOTE — Progress Notes (Signed)
  Phone Note Call from Patient Call back at [facility] Phone [phone]   Summary of Call: The pt is aware of the message but she wants that the provider contact the pain clinic to make sure that she is not taking any pain medication with them.  Pt states that she is suffering from three broken ribs. [phone] and [phone] [facility] FNP  Initial call taken by: Manon Hilding,  January 19, 2010 8:54 AM  Follow-up for Phone Call        DR CRIP OFFICE CALLED AND SAYS THAT HE WILL NOT PRESCRIBE ANY NARCOTICS TO HER, HIS # IS [phone] Follow-up by: Leodis Rains,  January 19, 2010 9:28 AM  Additional Follow-up for Phone Call Additional follow up Details #1::        MS Bake CALLED AGAIN AND SAYS THAT SHE DOESNT GO BACK TO THE PAIN CLINIC UNTIL THE 17th of MARCH. Additional Follow-up by: Leodis Rains,  January 20, 2010 9:05 AM    Additional Follow-up for Phone Call Additional follow up Details #2::    Unfortunately, I have stopped giving narcotics to this pt. I will not give her any pain medications. Follow-up by: Lehman Prom FNP,  January 20, 2010 2:10 PM  Additional Follow-up for Phone Call Additional follow up Details #3:: Details for Additional Follow-up Action Taken: pt informed of above information. Additional Follow-up by: Levon Hedger,  January 21, 2010 9:57 AM

## 2010-12-22 NOTE — Letter (Signed)
Summary: Lipid Letter  [facility]  [address] [place], [place] [address]   Phone: [phone]  Fax: [phone]    09/01/2010  Rita Newton [address] [place], [place]  [address]  Dear Rita Newton:  We have carefully reviewed your last lipid profile from 08/30/2010 and the results are noted below with a summary of recommendations for lipid management.    Cholesterol:       191     Goal: less than 200   HDL "good" Cholesterol:   43     Goal: greater then 40   LDL "bad" Cholesterol:   117     Goal: less than 130   Triglycerides:      154    Goal: less than 130    Labs done during recent office visit show that your triglyceride are elevated. No need for medications at this time but monitor fried and fatty foods.      Current Medications: 1)    Cymbalta 60 Mg Cpep (Duloxetine hcl) .Marland Kitchen.. 1 tablet by mouth two times a day **rx by psych** 2)    Lisinopril 40 Mg Tabs (Lisinopril) .... One tablet by mouth daily for blood pressure 3)    Dicyclomine Hcl 10 Mg Caps (Dicyclomine hcl) .Marland Kitchen.. 1 tablet by mouth three times daily with meals  If you have any questions, please call. We appreciate being able to work with you.   Sincerely,    [facility] Lehman Prom FNP

## 2010-12-22 NOTE — Letter (Signed)
Summary: PT HOME MEDICATION FAX REPORT  PT HOME MEDICATION FAX REPORT   Imported By: Arta Bruce 06/01/2010 10:10:18  _____________________________________________________________________  External Attachment:    Type:   Image     Comment:   External Document

## 2010-12-22 NOTE — Letter (Signed)
Summary: TEST ORDER FORM MAMMOGRAM//APPT DATE AND TIME  TEST ORDER FORM MAMMOGRAM//APPT DATE AND TIME   Imported By: Arta Bruce 08/31/2010 12:54:06  _____________________________________________________________________  External Attachment:    Type:   Image     Comment:   External Document

## 2010-12-22 NOTE — Letter (Signed)
Summary: PT INFORMATION Rita Newton PROGRAM  PT INFORMATION Rita Newton PROGRAM   Imported By: Arta Bruce 02/16/2010 09:51:53  _____________________________________________________________________  External Attachment:    Type:   Image     Comment:   External Document

## 2010-12-22 NOTE — Progress Notes (Signed)
Summary: Pain clinic d/c  Phone Note Outgoing Call   Summary of Call: received correspondence from [facility] that pt was being discharged - last visit 05/31/2010 please call pain clinic to find out why?  MRN [id number]  Initial call taken by: Lehman Prom FNP,  May 31, 2010 3:23 PM  Follow-up for Phone Call        Is scheduled for procedure 06/08/10 -- Angie from that office states that she spoke with Dr. Metta Clines and he has no intention of discharging her.  She believes that the letter was sent in error.  She will f/u and call back if she finds out anything different. Follow-up by: Dutch Quint RN,  May 31, 2010 3:33 PM  Additional Follow-up for Phone Call Additional follow up Details #1::        Good news Additional Follow-up by: Lehman Prom FNP,  May 31, 2010 3:47 PM

## 2010-12-22 NOTE — Progress Notes (Signed)
Summary: wants pain meds for broken ribs  Phone Note Call from Patient   Summary of Call: Pt states she fell and has three broken ribs and is in a lot of pain.  She does not go back to the pain clinic til 3/17 and wants to know if she can have something for the pain.  She said the pain clinic has only given her shots no meds yet and she "just can't take this pain."She went to the ED and given 20 pills, but they are gone.  [facility] can be reached at [phone]. Initial call taken by: Vesta Mixer CMA,  January 18, 2010 11:47 AM  Follow-up for Phone Call        Unfortunately, I will no longer prescribe pain medications for this pt in ANY capacity.  That was the agreement when she established at the pain clinic. I'm sorry for the inconvience.   Follow-up by: Lehman Prom FNP,  January 18, 2010 12:18 PM  Additional Follow-up for Phone Call Additional follow up Details #1::        Pt stopped by and informed of this descision. Additional Follow-up by: Vesta Mixer CMA,  January 18, 2010 12:23 PM

## 2010-12-22 NOTE — Progress Notes (Signed)
Summary: Office Visit//DEPRESSION SCREENING  Office Visit//DEPRESSION SCREENING   Imported By: Arta Bruce 08/31/2010 12:44:38  _____________________________________________________________________  External Attachment:    Type:   Image     Comment:   External Document

## 2011-02-02 LAB — POCT I-STAT, CHEM 8
Chloride: 101 mEq/L (ref 96–112)
Glucose, Bld: 105 mg/dL — ABNORMAL HIGH (ref 70–99)
HCT: 39 % (ref 36.0–46.0)
Hemoglobin: 13.3 g/dL (ref 12.0–15.0)
Potassium: 3.9 mEq/L (ref 3.5–5.1)
Sodium: 136 mEq/L (ref 135–145)

## 2011-02-05 LAB — URINALYSIS, ROUTINE W REFLEX MICROSCOPIC
Bilirubin Urine: NEGATIVE
Nitrite: NEGATIVE
Specific Gravity, Urine: 1.009 (ref 1.005–1.030)
Urobilinogen, UA: 0.2 mg/dL (ref 0.0–1.0)
pH: 7.5 (ref 5.0–8.0)

## 2011-02-05 LAB — POCT I-STAT, CHEM 8
Creatinine, Ser: 0.8 mg/dL (ref 0.4–1.2)
Glucose, Bld: 98 mg/dL (ref 70–99)
HCT: 38 % (ref 36.0–46.0)
Hemoglobin: 12.9 g/dL (ref 12.0–15.0)
Potassium: 3.9 mEq/L (ref 3.5–5.1)
Sodium: 140 mEq/L (ref 135–145)
TCO2: 24 mmol/L (ref 0–100)

## 2011-02-05 LAB — POCT CARDIAC MARKERS
CKMB, poc: <1 ng/mL — ABNORMAL LOW (ref 1.0–8.0)
Myoglobin, poc: 71.8 ng/mL (ref 12–200)
Myoglobin, poc: 73.7 ng/mL (ref 12–200)

## 2011-02-08 LAB — COMPREHENSIVE METABOLIC PANEL
ALT: 12 U/L (ref 0–35)
AST: 15 U/L (ref 0–37)
Alkaline Phosphatase: 109 U/L (ref 39–117)
CO2: 28 mEq/L (ref 19–32)
Chloride: 103 mEq/L (ref 96–112)
GFR calc Af Amer: >60 mL/min (ref >60)
GFR calc non Af Amer: >60 mL/min (ref >60)
Glucose, Bld: 110 mg/dL — ABNORMAL HIGH (ref 70–99)
Sodium: 138 mEq/L (ref 135–145)
Total Bilirubin: 0.4 mg/dL (ref 0.3–1.2)

## 2011-02-08 LAB — CBC
Hemoglobin: 12.4 g/dL (ref 12.0–15.0)
RBC: 3.58 MIL/uL — ABNORMAL LOW (ref 3.87–5.11)
WBC: 8 10*3/uL (ref 4.0–10.5)

## 2011-02-13 LAB — CBC
HCT: 32.8 % — ABNORMAL LOW (ref 36.0–46.0)
MCV: 101.2 fL — ABNORMAL HIGH (ref 78.0–100.0)
Platelets: 223 10*3/uL (ref 150–400)
Platelets: 259 10*3/uL (ref 150–400)
WBC: 14.1 10*3/uL — ABNORMAL HIGH (ref 4.0–10.5)
WBC: 7.3 10*3/uL (ref 4.0–10.5)

## 2011-02-13 LAB — BASIC METABOLIC PANEL
BUN: 5 mg/dL — ABNORMAL LOW (ref 6–23)
BUN: 7 mg/dL (ref 6–23)
Calcium: 9.1 mg/dL (ref 8.4–10.5)
Creatinine, Ser: 0.66 mg/dL (ref 0.4–1.2)
GFR calc Af Amer: >60 mL/min (ref >60)
GFR calc non Af Amer: >60 mL/min (ref >60)
GFR calc non Af Amer: >60 mL/min (ref >60)
Potassium: 4.2 mEq/L (ref 3.5–5.1)

## 2011-02-13 LAB — HCG, SERUM, QUALITATIVE: Preg, Serum: NEGATIVE

## 2011-02-17 ENCOUNTER — Emergency Department (HOSPITAL_COMMUNITY): Payer: Medicaid Other

## 2011-02-17 ENCOUNTER — Emergency Department (HOSPITAL_COMMUNITY)
Admission: EM | Admit: 2011-02-17 | Discharge: 2011-02-18 | Disposition: A | Discharge status: Home / Self Care | Payer: Medicaid Other | Attending: Emergency Medicine | Admitting: Emergency Medicine

## 2011-02-17 DIAGNOSIS — M545 Low back pain, unspecified: Secondary | ICD-10-CM | POA: Insufficient documentation

## 2011-02-17 DIAGNOSIS — F341 Dysthymic disorder: Secondary | ICD-10-CM | POA: Insufficient documentation

## 2011-02-17 DIAGNOSIS — M542 Cervicalgia: Secondary | ICD-10-CM | POA: Insufficient documentation

## 2011-02-17 DIAGNOSIS — M25559 Pain in unspecified hip: Secondary | ICD-10-CM | POA: Insufficient documentation

## 2011-02-17 DIAGNOSIS — G8929 Other chronic pain: Secondary | ICD-10-CM | POA: Insufficient documentation

## 2011-02-17 DIAGNOSIS — M546 Pain in thoracic spine: Secondary | ICD-10-CM | POA: Insufficient documentation

## 2011-02-17 DIAGNOSIS — IMO0002 Reserved for concepts with insufficient information to code with codable children: Secondary | ICD-10-CM | POA: Insufficient documentation

## 2011-02-17 DIAGNOSIS — Z79899 Other long term (current) drug therapy: Secondary | ICD-10-CM | POA: Insufficient documentation

## 2011-02-17 DIAGNOSIS — I10 Essential (primary) hypertension: Secondary | ICD-10-CM | POA: Insufficient documentation

## 2011-02-17 LAB — POCT I-STAT, CHEM 8
Calcium, Ion: 1.14 mmol/L (ref 1.12–1.32)
Creatinine, Ser: 1.1 mg/dL (ref 0.4–1.2)
Glucose, Bld: 96 mg/dL (ref 70–99)
Hemoglobin: 12.6 g/dL (ref 12.0–15.0)
TCO2: 32 mmol/L (ref 0–100)

## 2011-02-17 MED ORDER — IOHEXOL 300 MG/ML  SOLN
80.0000 mL | Freq: Once | INTRAMUSCULAR | Status: AC | PRN
  Administered 2011-02-17: 80 mL via INTRAVENOUS

## 2011-02-22 LAB — DIFFERENTIAL
Eosinophils Relative: 3 % (ref 0–5)
Lymphocytes Relative: 28 % (ref 12–46)
Lymphs Abs: 2.1 10*3/uL (ref 0.7–4.0)
Neutro Abs: 4.9 10*3/uL (ref 1.7–7.7)
Neutrophils Relative %: 63 % (ref 43–77)

## 2011-02-22 LAB — CBC
MCHC: 34.8 g/dL (ref 30.0–36.0)
MCV: 101.2 fL — ABNORMAL HIGH (ref 78.0–100.0)
RBC: 3.84 MIL/uL — ABNORMAL LOW (ref 3.87–5.11)

## 2011-02-22 LAB — URINALYSIS, ROUTINE W REFLEX MICROSCOPIC
Bilirubin Urine: NEGATIVE
Hgb urine dipstick: NEGATIVE
Protein, ur: NEGATIVE mg/dL
Specific Gravity, Urine: >1.046 — ABNORMAL HIGH (ref 1.005–1.030)
Urobilinogen, UA: 0.2 mg/dL (ref 0.0–1.0)

## 2011-02-22 LAB — COMPREHENSIVE METABOLIC PANEL
AST: 19 U/L (ref 0–37)
CO2: 25 mEq/L (ref 19–32)
Calcium: 8.9 mg/dL (ref 8.4–10.5)
Creatinine, Ser: 0.65 mg/dL (ref 0.4–1.2)
GFR calc Af Amer: >60 mL/min (ref >60)
GFR calc non Af Amer: >60 mL/min (ref >60)

## 2011-02-23 LAB — URINE MICROSCOPIC-ADD ON

## 2011-02-23 LAB — URINALYSIS, ROUTINE W REFLEX MICROSCOPIC
Glucose, UA: NEGATIVE mg/dL
Specific Gravity, Urine: 1.013 (ref 1.005–1.030)
pH: 7.5 (ref 5.0–8.0)

## 2011-02-26 ENCOUNTER — Emergency Department (HOSPITAL_COMMUNITY)
Admission: EM | Admit: 2011-02-26 | Discharge: 2011-02-26 | Disposition: A | Discharge status: Home / Self Care | Payer: Medicaid Other | Attending: Emergency Medicine | Admitting: Emergency Medicine

## 2011-02-26 DIAGNOSIS — I10 Essential (primary) hypertension: Secondary | ICD-10-CM | POA: Insufficient documentation

## 2011-02-26 DIAGNOSIS — F17200 Nicotine dependence, unspecified, uncomplicated: Secondary | ICD-10-CM | POA: Insufficient documentation

## 2011-02-26 DIAGNOSIS — M545 Low back pain, unspecified: Secondary | ICD-10-CM | POA: Insufficient documentation

## 2011-02-26 DIAGNOSIS — IMO0001 Reserved for inherently not codable concepts without codable children: Secondary | ICD-10-CM | POA: Insufficient documentation

## 2011-02-26 DIAGNOSIS — G8929 Other chronic pain: Secondary | ICD-10-CM | POA: Insufficient documentation

## 2011-04-04 NOTE — Op Note (Signed)
[name], Rita Newton               ACCOUNT NO.:  [id number]   MEDICAL RECORD NO.:  [id number]          PATIENT TYPE:  OIB   LOCATION:  3033                         FACILITY:  [facility]   PHYSICIAN:  Reinaldo Meeker, M.D. 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   PROCEDURE:  Left L4-5 interlaminar laminotomy for removal of synovial  cyst.   SURGEON:  Reinaldo Meeker, MD.   ASSISTANT:  Dr. Marikay Alar.   PROCEDURE IN DETAIL:  After placing in the prone position, the patient's  back was prepped and draped in the usual sterile fashion.  Localizing x-  rays were taken prior to incision to identify the appropriate level.  A  midline incision was made above the spinous processes of L4 and L5.  Using Bovie cutting current, the incision was carried on the spinous  processes.  Subperiosteal dissection was then carried out along the left  side of spinous processes and lamina and a self-retaining retractor was  placed for exposure.  X-rays showed approach to the appropriate level.  Using a high-speed drill, the inferior one-half of the L4 lamina, medial  one-third of facet joint, superior one-half of L5 lamina were removed.  Residual bone was removed with a Kerrison punch.  Ligamentum flavum was  removed in piecemeal fashion.  Microscope was draped and brought the  field used at the end of the case.  Using microsection technique, the  lateral aspect of thecal sac and L5 nerve were identified.  There was a  large cystic mass coming off the facet joint consistent with synovial  cyst and this was dissected free from the L5 nerve root where it was  compressing it downward.  This was then removed in a piecemeal fashion.  There was excellent compression of the thecal sac and the entire course  of the L5 nerve  until it went around the pedicle.  At this time,  inspection was carried out in all directions for any evidence of  residual compression, none could be identified.  The disk was identified  and found to be nonpathologic.  At this time, large amounts of  irrigation were carried out.  Any bleeding controlled bipolar  coagulation and Gelfoam.  The wound was then closed in multiple layers  of Vicryl in the muscle fascia and subcutaneous subcuticular tissues and  staples were placed on the skin.  A sterile dressing was then applied.  The patient was extubated and taken to recovery room in stable  condition.           ______________________________  Reinaldo Meeker, M.D.     ROK/MEDQ  D:  06/18/2008  T:  06/19/2008  Job:  [id number]

## 2011-08-18 LAB — BASIC METABOLIC PANEL
BUN: 7
Calcium: 9.3
Creatinine, Ser: 0.72
GFR calc Af Amer: >60

## 2011-08-18 LAB — CBC
MCHC: 34.1
Platelets: 247
RBC: 3.89
WBC: 8.5

## 2012-05-03 ENCOUNTER — Emergency Department (HOSPITAL_COMMUNITY): Payer: Medicaid Other

## 2012-05-03 ENCOUNTER — Encounter (HOSPITAL_COMMUNITY): Payer: Self-pay | Admitting: Emergency Medicine

## 2012-05-03 ENCOUNTER — Emergency Department (HOSPITAL_COMMUNITY)
Admission: EM | Admit: 2012-05-03 | Discharge: 2012-05-03 | Disposition: A | Discharge status: Home / Self Care | Payer: Medicaid Other | Attending: Emergency Medicine | Admitting: Emergency Medicine

## 2012-05-03 DIAGNOSIS — G8929 Other chronic pain: Secondary | ICD-10-CM | POA: Insufficient documentation

## 2012-05-03 DIAGNOSIS — G43909 Migraine, unspecified, not intractable, without status migrainosus: Secondary | ICD-10-CM | POA: Insufficient documentation

## 2012-05-03 DIAGNOSIS — M542 Cervicalgia: Secondary | ICD-10-CM | POA: Insufficient documentation

## 2012-05-03 DIAGNOSIS — F17200 Nicotine dependence, unspecified, uncomplicated: Secondary | ICD-10-CM | POA: Insufficient documentation

## 2012-05-03 DIAGNOSIS — R51 Headache: Secondary | ICD-10-CM | POA: Insufficient documentation

## 2012-05-03 HISTORY — DX: Other chronic pain: G89.29

## 2012-05-03 HISTORY — DX: Migraine, unspecified, not intractable, without status migrainosus: G43.909

## 2012-05-03 MED ORDER — HYDROMORPHONE HCL PF 1 MG/ML IJ SOLN
0.5000 mg | Freq: Once | INTRAMUSCULAR | Status: AC
  Administered 2012-05-03: 0.5 mg via INTRAVENOUS
  Filled 2012-05-03: qty 1

## 2012-05-03 MED ORDER — HYDROCODONE-ACETAMINOPHEN 5-500 MG PO TABS: 1.0000 | ORAL_TABLET | Freq: Four times a day (QID) | ORAL | Status: AC | PRN

## 2012-05-03 MED ORDER — MORPHINE SULFATE 4 MG/ML IJ SOLN
4.0000 mg | Freq: Once | INTRAMUSCULAR | Status: AC
  Administered 2012-05-03: 4 mg via INTRAVENOUS
  Filled 2012-05-03: qty 1

## 2012-05-03 MED ORDER — LORAZEPAM 2 MG/ML IJ SOLN
1.0000 mg | Freq: Once | INTRAMUSCULAR | Status: AC
  Administered 2012-05-03: 1 mg via INTRAVENOUS
  Filled 2012-05-03: qty 1

## 2012-05-03 MED ORDER — ONDANSETRON HCL 4 MG/2ML IJ SOLN
4.0000 mg | Freq: Once | INTRAMUSCULAR | Status: AC
  Administered 2012-05-03: 4 mg via INTRAVENOUS
  Filled 2012-05-03: qty 2

## 2012-05-03 MED ORDER — SODIUM CHLORIDE 0.9 % IV BOLUS (SEPSIS)
500.0000 mL | Freq: Once | INTRAVENOUS | Status: AC
  Administered 2012-05-03: 500 mL via INTRAVENOUS
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
## 2012-05-03 NOTE — ED Provider Notes (Addendum)
History     CSN: [id number]  Arrival date & time 05/03/12  3244   First MD Initiated Contact with Patient 05/03/12 1110      Chief Complaint  Patient presents with  . Headache    (Consider location/radiation/quality/duration/timing/severity/associated sxs/prior treatment) Patient is a 56 y.o. female presenting with headaches. The history is provided by the patient and the spouse.  Headache  Pertinent negatives include no fever and no shortness of breath.  pt c/o headache onset this morning. States hx occasional headaches in past, ?'migraine', esp when under stress, states current headache onset this morning when awoke. Worse than previous headaches. Acute in onset. Constant, dull. Diffuse. Non radiating. No specific exacerbating or alleviating factors.  States had been having neck problems for past few days, stating is unsure if slept wrong on neck. No neck stiffness/rigidity. No fevers, chills or sweats. Denies sinus congestion, drainage or pain. No sore throat. 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 Denies eye pain or change in vision. No numbness/weakness, no change in speech. No problems w balance or coordination. No recent head injury, trauma or fall.      Past Medical History  Diagnosis Date  . Migraine   . Chronic pain     No past surgical history on file.  No family history on file.  History  Substance Use Topics  . Smoking status: Current Everyday Smoker  . Smokeless tobacco: Not on file  . Alcohol Use: No    OB History    Grav Para Term Preterm Abortions TAB SAB Ect Mult Living                  Review of Systems  Constitutional: Negative for fever and chills.  HENT: Negative for congestion, rhinorrhea and neck pain.   Eyes: Negative for photophobia, pain, redness and visual disturbance.  Respiratory: Negative for cough and shortness of breath.    Cardiovascular: Negative for chest pain.  Gastrointestinal: Negative for abdominal pain.  Genitourinary: Negative for flank pain.  Musculoskeletal: Negative for back pain.  Skin: Negative for rash.  Neurological: Positive for headaches. Negative for syncope, speech difficulty, weakness, light-headedness and numbness.  Hematological: Does not bruise/bleed easily.  Psychiatric/Behavioral: Negative for confusion.    Allergies  Review of patient's allergies indicates no known allergies.  Home Medications   Current Outpatient Rx  Name Route Sig Dispense Refill  . DULOXETINE HCL 60 MG PO CPEP Oral Take 120 mg by mouth daily.    Marland Kitchen GABAPENTIN 800 MG PO TABS Oral Take 800 mg by mouth 2 (two) times daily.    . IBUPROFEN 200 MG PO TABS Oral Take 1,000 mg by mouth once as needed. For pain.    Marland Kitchen LISINOPRIL 10 MG PO TABS Oral Take 10 mg by mouth 2 (two) times daily.      BP 165/90  Pulse 65  Temp 97.9 F (36.6 C) (Oral)  Resp 16  SpO2 98%  Physical Exam  Nursing note and vitals reviewed. Constitutional: She is oriented to person, place, and time. She appears well-developed and well-nourished. No distress.  HENT:  Nose: Nose normal.  Mouth/Throat: Oropharynx is clear and moist.       No sinus or temporal tenderness.  Right tm w small perforation, no purulent drainage, no mastoid tenderness  Eyes: Conjunctivae and EOM are normal. Pupils are equal, round, and reactive to light. No  scleral icterus.  Neck: Normal range of motion. Neck supple. No tracheal deviation present.       No stiffness or rigidity. c spine non tender.   Cardiovascular: Normal rate, regular rhythm, normal heart sounds and intact distal pulses.   Pulmonary/Chest: Effort normal and breath sounds normal. No respiratory distress.  Abdominal: Soft. Normal appearance. She exhibits no distension. There is no tenderness.  Musculoskeletal: She exhibits no edema and no tenderness.  Neurological: She is alert and oriented to  person, place, and time. No cranial nerve deficit.       Motor intact bil. No pronator drift. Steady gait.   Skin: Skin is warm and dry. No rash noted.  Psychiatric: She has a normal mood and affect.    ED Course  Procedures (including critical care time)     MDM  Iv ns. Morphine iv. zofran iv. Ct.   Pt notes acute onset severe headache, neck soreness, worse/different than any prior headache.  Discussed diff dx including SAH and that CT will miss a small percentage of SAH, pt voices understanding and reasons for recommending LP.  Pt voices apprehension LP, states hx multiple low back problems, ddd, epidurals, etc. Pt willing to have LP w flouro/radiology.   Signed out to CDU PA Cyndie Chime to check LP results when back, if neg and pt improved, may d/c home then.   Pt refuses to have lp done.   Again explained rationale for lp, importance of being able to rule out bleeding in head, bleeding from aneurysm, and infection (less likely).  Discussed that risks include intracranial hemorrhage, stroke w permanent loss of function, and death. Pt states is alert, oriented, and express understanding of reasons for LP and potential risks -  Pt is not willing to stay for LP or have further testing done, requests d/c from ed.   Pt requests pain rx for home  Suzi Roots, MD 05/03/12 1339  Suzi Roots, MD 05/03/12 1439  Suzi Roots, MD 05/03/12 [phone]

## 2012-05-03 NOTE — ED Notes (Signed)
Pt states she does not want the lumbar puncture. States she just wants to be able to go home and baby sit her grandchildren tonight. Dr Denton Lank aware.

## 2012-05-03 NOTE — ED Notes (Signed)
Patient transported to CT 

## 2012-05-03 NOTE — ED Notes (Signed)
Patient being transported to CDU 10.

## 2012-05-03 NOTE — Discharge Instructions (Signed)
Return if reconsider and are willing to have LP done.  Follow up with primary care doctor in the next couple days - have your blood pressure rechecked then as it is high today. Return to ER if worse, worsening or severe head pain, vomiting, fevers, other concern.   You were given pain medication in the ER - no driving for the next 6 hours.  You may take vicodin as need for pain. No driving for the next 6 hours or when taking vicodin. Also, do not take tylenol or acetaminophen containing medication when taking vicodin.          Headache, General, Unknown Cause The specific cause of your headache may not have been found today. There are many causes and types of headache. A few common ones are:  Tension headache.   Migraine.   Infections (examples: dental and sinus infections).   Bone and/or joint problems in the neck or jaw.   Depression.   Eye problems.  These headaches are not life threatening.  Headaches can sometimes be diagnosed by a patient history and a physical exam. Sometimes, lab and imaging studies (such as x-ray and/or CT scan) are used to rule out more serious problems. In some cases, a spinal tap (lumbar puncture) may be requested. There are many times when your exam and tests may be normal on the first visit even when there is a serious problem causing your headaches. Because of that, it is very important to follow up with your doctor or local clinic for further evaluation. FINDING OUT THE RESULTS OF TESTS  If a radiology test was performed, a radiologist will review your results.   You will be contacted by the emergency department or your physician if any test results require a change in your treatment plan.   Not all test results may be available during your visit. If your test results are not back during the visit, make an appointment with your caregiver to find out the results. Do not assume everything is normal if you have not heard from your caregiver or the  medical facility. It is important for you to follow up on all of your test results.  HOME CARE INSTRUCTIONS   Keep follow-up appointments with your caregiver, or any specialist referral.   Only take over-the-counter or prescription medicines for pain, discomfort, or fever as directed by your caregiver.   Biofeedback, massage, or other relaxation techniques may be helpful.   Ice packs or heat applied to the head and neck can be used. Do this three to four times per day, or as needed.   Call your doctor if you have any questions or concerns.   If you smoke, you should quit.  SEEK MEDICAL CARE IF:   You develop problems with medications prescribed.   You do not respond to or obtain relief from medications.   You have a change from the usual headache.   You develop nausea or vomiting.  SEEK IMMEDIATE MEDICAL CARE IF:   If your headache becomes severe.   You have an unexplained oral temperature above 102 F (38.9 C), or as your caregiver suggests.   You have a stiff neck.   You have loss of vision.   You have muscular weakness.   You have loss of muscular control.   You develop severe symptoms different from your first symptoms.   You start losing your balance or have trouble walking.   You feel faint or pass out.  MAKE SURE YOU:  Understand these instructions.   Will watch your condition.   Will get help right away if you are not doing well or get worse.  Document Released: 11/06/2005 Document Revised: 10/26/2011 Document Reviewed: 06/25/2008 [facility] Patient Information 2012 [place], [place].      Hypertension As your heart beats, it forces blood through your arteries. This force is your blood pressure. If the pressure is too high, it is called hypertension (HTN) or high blood pressure. HTN is dangerous because you may have it and not know it. High blood pressure may mean that your heart has to work harder to pump blood. Your arteries may be narrow or stiff. The  extra work puts you at risk for heart disease, stroke, and other problems.  Blood pressure consists of two numbers, a higher number over a lower, 110/72, for example. It is stated as "110 over 72." The ideal is below 120 for the top number (systolic) and under 80 for the bottom (diastolic). Write down your blood pressure today. You should pay close attention to your blood pressure if you have certain conditions such as:  Heart failure.   Prior heart attack.   Diabetes   Chronic kidney disease.   Prior stroke.   Multiple risk factors for heart disease.  To see if you have HTN, your blood pressure should be measured while you are seated with your arm held at the level of the heart. It should be measured at least twice. A one-time elevated blood pressure reading (especially in the Emergency Department) does not mean that you need treatment. There may be conditions in which the blood pressure is different between your right and left arms. It is important to see your caregiver soon for a recheck. Most people have essential hypertension which means that there is not a specific cause. This type of high blood pressure may be lowered by changing lifestyle factors such as:  Stress.   Smoking.   Lack of exercise.   Excessive weight.   Drug/tobacco/alcohol use.   Eating less salt.  Most people do not have symptoms from high blood pressure until it has caused damage to the body. Effective treatment can often prevent, delay or reduce that damage. TREATMENT  When a cause has been identified, treatment for high blood pressure is directed at the cause. There are a large number of medications to treat HTN. These fall into several categories, and your caregiver will help you select the medicines that are best for you. Medications may have side effects. You should review side effects with your caregiver. If your blood pressure stays high after you have made lifestyle changes or started on medicines,    Your medication(s) may need to be changed.   Other problems may need to be addressed.   Be certain you understand your prescriptions, and know how and when to take your medicine.   Be sure to follow up with your caregiver within the time frame advised (usually within two weeks) to have your blood pressure rechecked and to review your medications.   If you are taking more than one medicine to lower your blood pressure, make sure you know how and at what times they should be taken. Taking two medicines at the same time can result in blood pressure that is too low.  SEEK IMMEDIATE MEDICAL CARE IF:  You develop a severe headache, blurred or changing vision, or confusion.   You have unusual weakness or numbness, or a faint feeling.   You have severe chest  or abdominal pain, vomiting, or breathing problems.  MAKE SURE YOU:   Understand these instructions.   Will watch your condition.   Will get help right away if you are not doing well or get worse.  Document Released: 11/06/2005 Document Revised: 10/26/2011 Document Reviewed: 06/26/2008 [facility] Patient Information 2012 [place], [place].      Rejection of Medical Treatment, Against Medical Advice Medical examination, treatment, or testing has been recommended for me. I have decided to reject further treatment or medical evaluation, and will leave the facility. I am rejecting medical care of my own choice, and contrary to the instructions and wishes of __________________________________________________, the treating physician. I understand that permanent harm, or even death, can occur from failing to follow the recommendations of the physician. I have had an opportunity to ask questions and fully understand my medical condition. I have been advised that I may return at any time to continue medical evaluation or treatment.  Because I am rejecting recommended medical care, I agree to absolve and release the physician and the medical  facility from any and all liabilities for damages arising from any current medical condition. I accept all risk associated with my medical condition, both known and unknown. I assume all responsibility for this action. I agree that I will make no claim of any nature against this medical facility or the physician under any circumstances. __________________________________________________ Name of Patient __________________________________________________ Signature of Patient or Guardian of Same __________________________________________________ Date Document Released: 11/06/2005 Document Revised: 10/26/2011 Document Reviewed: 06/25/2008 [facility] Patient Information 2012 [place], [place].

## 2012-05-03 NOTE — ED Notes (Signed)
Pt c/o migraine HA x 2 days; pt sts in neck to back of head; pt sts hx of same

## 2012-06-17 ENCOUNTER — Emergency Department (HOSPITAL_COMMUNITY)
Admission: EM | Admit: 2012-06-17 | Discharge: 2012-06-17 | Disposition: A | Discharge status: Home / Self Care | Payer: Medicaid Other | Attending: Emergency Medicine | Admitting: Emergency Medicine

## 2012-06-17 ENCOUNTER — Encounter (HOSPITAL_COMMUNITY): Payer: Self-pay | Admitting: *Deleted

## 2012-06-17 DIAGNOSIS — F17200 Nicotine dependence, unspecified, uncomplicated: Secondary | ICD-10-CM | POA: Insufficient documentation

## 2012-06-17 DIAGNOSIS — G8929 Other chronic pain: Secondary | ICD-10-CM | POA: Insufficient documentation

## 2012-06-17 DIAGNOSIS — H9209 Otalgia, unspecified ear: Secondary | ICD-10-CM | POA: Insufficient documentation

## 2012-06-17 DIAGNOSIS — I10 Essential (primary) hypertension: Secondary | ICD-10-CM | POA: Insufficient documentation

## 2012-06-17 DIAGNOSIS — H6090 Unspecified otitis externa, unspecified ear: Secondary | ICD-10-CM

## 2012-06-17 HISTORY — DX: Essential (primary) hypertension: I10

## 2012-06-17 MED ORDER — OXYCODONE-ACETAMINOPHEN 5-325 MG PO TABS
1.0000 | ORAL_TABLET | Freq: Once | ORAL | Status: AC
  Administered 2012-06-17: 1 via ORAL
  Filled 2012-06-17: qty 1

## 2012-06-17 MED ORDER — AMOXICILLIN-POT CLAVULANATE 875-125 MG PO TABS: 1.0000 | ORAL_TABLET | Freq: Two times a day (BID) | ORAL | Status: DC

## 2012-06-17 MED ORDER — OXYCODONE-ACETAMINOPHEN 5-325 MG PO TABS: 1.0000 | ORAL_TABLET | Freq: Four times a day (QID) | ORAL | Status: AC | PRN

## 2012-06-17 NOTE — ED Provider Notes (Signed)
History   This chart was scribed for Rita Lennert, MD by Sofie Rower. The patient was seen in room TR05C/TR05C and the patient's care was started at 12:53 PM     CSN: [id number]  Arrival date & time 06/17/12  1107   None     Chief Complaint  Patient presents with  . Otalgia    (Consider location/radiation/quality/duration/timing/severity/associated sxs/prior treatment) Patient is a 56 y.o. female presenting with ear pain. The history is provided by the patient. No language interpreter was used.  Otalgia This is a new problem. The current episode started more than 1 week ago. There is pain in the right ear. The problem occurs constantly. The problem has been gradually worsening. There has been no fever. The pain is moderate. Associated symptoms include headaches. Pertinent negatives include no vomiting and no cough. Associated symptoms comments: Jaw pain.   Modifying factors include application of eardrops (for previous ear infection) which does not provide relief.   PCP is Dr. Daphine Deutscher.   Past Medical History  Diagnosis Date  . Migraine   . Chronic pain   . Hypertension     History reviewed. No pertinent past surgical history.  History reviewed. No pertinent family history.  History  Substance Use Topics  . Smoking status: Current Everyday Smoker  . Smokeless tobacco: Not on file  . Alcohol Use: No    OB History    Grav Para Term Preterm Abortions TAB SAB Ect Mult Living                  Review of Systems  HENT: Positive for ear pain.   Respiratory: Negative for cough.   Gastrointestinal: Negative for vomiting.  Neurological: Positive for headaches.  All other systems reviewed and are negative.    Allergies  Review of patient's allergies indicates no known allergies.  Home Medications   Current Outpatient Rx  Name Route Sig Dispense Refill  . DULOXETINE HCL 60 MG PO CPEP Oral Take 120 mg by mouth daily.    Marland Kitchen GABAPENTIN 800 MG PO TABS Oral Take 800 mg by  mouth 2 (two) times daily.    . IBUPROFEN 200 MG PO TABS Oral Take 600-800 mg by mouth every 8 (eight) hours as needed. For pain.    Marland Kitchen LISINOPRIL 10 MG PO TABS Oral Take 10 mg by mouth 2 (two) times daily.      BP 156/105  Pulse 88  Temp 98.3 F (36.8 C) (Oral)  Resp 16  SpO2 96%  Physical Exam  Nursing note and vitals reviewed. Constitutional: She is oriented to person, place, and time. She appears well-developed.  HENT:  Head: Normocephalic.       Right ear canal is tender and swollen.   Eyes: Conjunctivae are normal.  Neck: No tracheal deviation present.  Cardiovascular:  No murmur heard. Musculoskeletal: Normal range of motion.  Neurological: She is oriented to person, place, and time.  Skin: Skin is warm.  Psychiatric: She has a normal mood and affect.    ED Course  Procedures (including critical care time)  DIAGNOSTIC STUDIES: Oxygen Saturation is 96% on room air, adequate by my interpretation.    COORDINATION OF CARE:    12:58PM- EDP at bedside discusses treatment plan concerning management of ear infection, application of ear wick.   1:08PM- EDP at bedside places ear wick in the pt right ear.   Labs Reviewed - No data to display No results found.   No diagnosis found.  MDM        The chart was scribed for me under my direct supervision.  I personally performed the history, physical, and medical decision making and all procedures in the evaluation of this patient.Rita Lennert, MD 06/17/12 1318

## 2012-06-17 NOTE — ED Notes (Signed)
Pt reports earache x 2 weeks.

## 2012-06-24 ENCOUNTER — Emergency Department (HOSPITAL_COMMUNITY)
Admission: EM | Admit: 2012-06-24 | Discharge: 2012-06-24 | Discharge status: Against Medical Advice | Payer: Medicaid Other | Attending: Emergency Medicine | Admitting: Emergency Medicine

## 2012-06-24 DIAGNOSIS — Z0389 Encounter for observation for other suspected diseases and conditions ruled out: Secondary | ICD-10-CM | POA: Insufficient documentation

## 2012-06-24 NOTE — ED Notes (Signed)
Called no answer

## 2012-06-24 NOTE — ED Notes (Signed)
Pt has been called at 1110 to be triaged with no answer

## 2012-06-24 NOTE — ED Notes (Signed)
Pt was called a second time at 11:20 to be triaged by nurse with no answer

## 2012-08-23 ENCOUNTER — Observation Stay (HOSPITAL_COMMUNITY)
Admission: EM | Admit: 2012-08-23 | Discharge: 2012-08-23 | Disposition: A | Discharge status: Home / Self Care | Payer: Medicaid Other | Attending: Emergency Medicine | Admitting: Emergency Medicine

## 2012-08-23 ENCOUNTER — Encounter (HOSPITAL_COMMUNITY): Payer: Self-pay | Admitting: Emergency Medicine

## 2012-08-23 DIAGNOSIS — Z79899 Other long term (current) drug therapy: Secondary | ICD-10-CM | POA: Insufficient documentation

## 2012-08-23 DIAGNOSIS — F3289 Other specified depressive episodes: Secondary | ICD-10-CM | POA: Insufficient documentation

## 2012-08-23 DIAGNOSIS — G8929 Other chronic pain: Secondary | ICD-10-CM | POA: Insufficient documentation

## 2012-08-23 DIAGNOSIS — M549 Dorsalgia, unspecified: Secondary | ICD-10-CM

## 2012-08-23 DIAGNOSIS — M545 Low back pain, unspecified: Principal | ICD-10-CM | POA: Insufficient documentation

## 2012-08-23 DIAGNOSIS — R0602 Shortness of breath: Secondary | ICD-10-CM | POA: Insufficient documentation

## 2012-08-23 DIAGNOSIS — F329 Major depressive disorder, single episode, unspecified: Secondary | ICD-10-CM | POA: Insufficient documentation

## 2012-08-23 DIAGNOSIS — F17200 Nicotine dependence, unspecified, uncomplicated: Secondary | ICD-10-CM | POA: Insufficient documentation

## 2012-08-23 HISTORY — DX: Major depressive disorder, single episode, unspecified: F32.9

## 2012-08-23 HISTORY — DX: Depression, unspecified: F32.A

## 2012-08-23 MED ORDER — SODIUM CHLORIDE 0.9 % IV BOLUS (SEPSIS): 1000.0000 mL | Freq: Once | INTRAVENOUS | Status: DC

## 2012-08-23 MED ORDER — HYDROMORPHONE HCL PF 1 MG/ML IJ SOLN: 1.0000 mg | INTRAMUSCULAR | Status: DC | PRN

## 2012-08-23 MED ORDER — DIAZEPAM 5 MG PO TABS: 5.0000 mg | ORAL_TABLET | Freq: Two times a day (BID) | ORAL | Status: DC | PRN

## 2012-08-23 MED ORDER — HYDROCODONE-ACETAMINOPHEN 5-325 MG PO TABS
2.0000 | ORAL_TABLET | Freq: Once | ORAL | Status: AC
  Administered 2012-08-23: 2 via ORAL
  Filled 2012-08-23: qty 2

## 2012-08-23 MED ORDER — IBUPROFEN 400 MG PO TABS
800.0000 mg | ORAL_TABLET | Freq: Once | ORAL | Status: DC
  Filled 2012-08-23: qty 2

## 2012-08-23 MED ORDER — IBUPROFEN 800 MG PO TABS: 800.0000 mg | ORAL_TABLET | Freq: Three times a day (TID) | ORAL | Status: AC

## 2012-08-23 MED ORDER — HYDROMORPHONE HCL PF 1 MG/ML IJ SOLN
1.0000 mg | Freq: Once | INTRAMUSCULAR | Status: AC
  Administered 2012-08-23: 1 mg via INTRAVENOUS
  Filled 2012-08-23: qty 1

## 2012-08-23 MED ORDER — DIAZEPAM 5 MG PO TABS
5.0000 mg | ORAL_TABLET | Freq: Once | ORAL | Status: AC
  Administered 2012-08-23: 5 mg via ORAL
  Filled 2012-08-23: qty 1

## 2012-08-23 MED ORDER — HYDROCODONE-ACETAMINOPHEN 5-325 MG PO TABS: 2.0000 | ORAL_TABLET | Freq: Three times a day (TID) | ORAL | Status: DC | PRN

## 2012-08-23 MED ORDER — ONDANSETRON HCL 4 MG/2ML IJ SOLN: 4.0000 mg | Freq: Four times a day (QID) | INTRAMUSCULAR | Status: DC | PRN

## 2012-08-23 NOTE — ED Notes (Signed)
Back pain x 3 days all along lower back denies immed injuy but did say she locked herself out and had to climb thru window and thenfalling a couple of weeks ago

## 2012-08-23 NOTE — ED Notes (Signed)
Pt given prescription for Valium and Vicodin.

## 2012-08-23 NOTE — ED Notes (Signed)
C/o chronic lower back pain, worse over past few days after crawling through window. Denying any bladder or bowel incontinence. Pt lying on side, tearful.

## 2012-08-23 NOTE — ED Provider Notes (Signed)
History   This chart was scribed for Gerhard Munch, MD by Melba Coon. The patient was seen in room TR11C/TR11C and the patient's care was started at 11:32AM.    CSN: [id number]  Arrival date & time 08/23/12  8295   First MD Initiated Contact with Patient 08/23/12 1107      No chief complaint on file.   (Consider location/radiation/quality/duration/timing/severity/associated sxs/prior treatment) The history is provided by the patient. No language interpreter was used.   Rita Newton is a 56 y.o. female who presents to the Emergency Department complaining of constant, moderate to severe lower back pain with an onset 3 days ago. Hx of left leg sciatica that has worsened since onset. Rita Newton reports she had to break into her own house about a week ago when she locked her keys around her house. She reports she can't stand up straight or roll over in bed without pain. Advil does not alleviate the pain. Ambulation is decreased compared to baseline with pain. Reports SOB. Denies HA, fever, neck pain, sore throat, rash, CP, abd pain, n/v/d, dysuria, bowel or bladder dysfunction, or extremity edema, weakness, numbness, or tingling. No known allergies. No other pertinent medical symptoms. She is a current smoker.  Past Medical History  Diagnosis Date  . Migraine   . Chronic pain   . Hypertension   . Depression     History reviewed. No pertinent past surgical history.  No family history on file.  History  Substance Use Topics  . Smoking status: Current Every Day Smoker  . Smokeless tobacco: Not on file  . Alcohol Use: No    OB History    Grav Para Term Preterm Abortions TAB SAB Ect Mult Living                  Review of Systems 10 Systems reviewed and all are negative for acute change except as noted in the HPI.   Allergies  Review of patient's allergies indicates no known allergies.  Home Medications   Current Outpatient Rx  Name Route Sig Dispense Refill  .  DULOXETINE HCL 60 MG PO CPEP Oral Take 120 mg by mouth daily.    Marland Kitchen GABAPENTIN 800 MG PO TABS Oral Take 800 mg by mouth 2 (two) times daily.    . IBUPROFEN 200 MG PO TABS Oral Take 600-800 mg by mouth every 8 (eight) hours as needed. For pain.    Marland Kitchen LISINOPRIL 10 MG PO TABS Oral Take 10 mg by mouth 2 (two) times daily.      BP 159/127  Pulse 94  Temp 98.2 F (36.8 C) (Oral)  Resp 20  SpO2 95%  Physical Exam  Nursing note and vitals reviewed. Constitutional: She is oriented to person, place, and time. She appears well-developed and well-nourished. No distress.       She is oriented to person, place, and time.  HENT:  Head: Normocephalic and atraumatic.  Eyes: EOM are normal.  Neck: Neck supple. No tracheal deviation present.  Cardiovascular: Normal rate.   Pulmonary/Chest: Effort normal. No respiratory distress. She has wheezes (bilaterally).  Musculoskeletal: Normal range of motion.       Good bilateral hip flexion.  Neurological: She is alert and oriented to person, place, and time.       LE strength 5/5 in hips/knees/ankles F/E.  Some encouragement needed 2/2 pain limitation.   Skin: Skin is warm and dry.  Psychiatric: She has a normal mood and affect. Her behavior is normal.  ED Course  Procedures (including critical care time)  COORDINATION OF CARE:  11:37AM - valium, norco/vicodin, and ibuprofen was ordered for Rita Newton. She rejects an offer for a breathing treatment. 12:42 PM - Patient not improved w/ oral meds. Patient to CDU for back pain protocol  12:46PM - Rita Newton is refusing the back pain protocol and being transferred to CDU due to her grandchild's birthday later this afternoon. She will be given dilaudid to take home and is ready for d/c.  Labs Reviewed - No data to display No results found.   No diagnosis found.    MDM    The patient presents with acute on chronic back pain. On exam she is no neurovascular deficits, is in no distress, but is  uncomfortable. Given the absence of trauma, new neurologic deficits, distress, but with persistent back pain the patient was transferred to the CDU under the back pain protocol for additional management.    This female presents with back pain.  Notably, the patient minimal improvement with oral medications, but preferred I. and intervention, we'll and discharged prior to further observation and pain management here.  Absent distress, neurologic deficits, and with the patient's capacity to make this decision this was accommodated.  Gerhard Munch, MD 08/23/12 1949

## 2012-09-23 ENCOUNTER — Other Ambulatory Visit: Payer: Self-pay | Admitting: Nurse Practitioner

## 2012-09-23 DIAGNOSIS — M545 Low back pain: Secondary | ICD-10-CM

## 2012-09-28 ENCOUNTER — Other Ambulatory Visit: Payer: Medicaid Other

## 2012-12-16 ENCOUNTER — Encounter (HOSPITAL_COMMUNITY): Payer: Self-pay | Admitting: *Deleted

## 2012-12-16 ENCOUNTER — Emergency Department (HOSPITAL_COMMUNITY): Payer: Medicaid Other

## 2012-12-16 ENCOUNTER — Emergency Department (HOSPITAL_COMMUNITY)
Admission: EM | Admit: 2012-12-16 | Discharge: 2012-12-16 | Disposition: A | Discharge status: Home / Self Care | Payer: Medicaid Other | Attending: Emergency Medicine | Admitting: Emergency Medicine

## 2012-12-16 DIAGNOSIS — M545 Low back pain, unspecified: Secondary | ICD-10-CM

## 2012-12-16 DIAGNOSIS — Z9889 Other specified postprocedural states: Secondary | ICD-10-CM | POA: Insufficient documentation

## 2012-12-16 DIAGNOSIS — G8929 Other chronic pain: Secondary | ICD-10-CM | POA: Insufficient documentation

## 2012-12-16 DIAGNOSIS — IMO0002 Reserved for concepts with insufficient information to code with codable children: Secondary | ICD-10-CM | POA: Insufficient documentation

## 2012-12-16 DIAGNOSIS — F329 Major depressive disorder, single episode, unspecified: Secondary | ICD-10-CM | POA: Insufficient documentation

## 2012-12-16 DIAGNOSIS — Y9389 Activity, other specified: Secondary | ICD-10-CM | POA: Insufficient documentation

## 2012-12-16 DIAGNOSIS — M5430 Sciatica, unspecified side: Secondary | ICD-10-CM | POA: Insufficient documentation

## 2012-12-16 DIAGNOSIS — Z79899 Other long term (current) drug therapy: Secondary | ICD-10-CM | POA: Insufficient documentation

## 2012-12-16 DIAGNOSIS — F3289 Other specified depressive episodes: Secondary | ICD-10-CM | POA: Insufficient documentation

## 2012-12-16 DIAGNOSIS — Z8679 Personal history of other diseases of the circulatory system: Secondary | ICD-10-CM | POA: Insufficient documentation

## 2012-12-16 DIAGNOSIS — W010XXA Fall on same level from slipping, tripping and stumbling without subsequent striking against object, initial encounter: Secondary | ICD-10-CM | POA: Insufficient documentation

## 2012-12-16 DIAGNOSIS — R52 Pain, unspecified: Secondary | ICD-10-CM | POA: Insufficient documentation

## 2012-12-16 DIAGNOSIS — I10 Essential (primary) hypertension: Secondary | ICD-10-CM | POA: Insufficient documentation

## 2012-12-16 DIAGNOSIS — F17200 Nicotine dependence, unspecified, uncomplicated: Secondary | ICD-10-CM | POA: Insufficient documentation

## 2012-12-16 DIAGNOSIS — Y9289 Other specified places as the place of occurrence of the external cause: Secondary | ICD-10-CM | POA: Insufficient documentation

## 2012-12-16 MED ORDER — MORPHINE SULFATE 4 MG/ML IJ SOLN
4.0000 mg | Freq: Once | INTRAMUSCULAR | Status: AC
  Administered 2012-12-16: 4 mg via INTRAMUSCULAR
  Filled 2012-12-16: qty 1

## 2012-12-16 MED ORDER — HYDROCODONE-ACETAMINOPHEN 5-500 MG PO TABS: 1.0000 | ORAL_TABLET | Freq: Four times a day (QID) | ORAL | Status: DC | PRN

## 2012-12-16 NOTE — ED Notes (Signed)
Pt reports fell in kitchen yesterday and having lower back pain. Hx of sciatica.

## 2012-12-16 NOTE — ED Notes (Signed)
Patient states slid on some water in the kitchen yesterday and has reinjured her lower back.  Patient claims she has sciatica.

## 2012-12-16 NOTE — ED Provider Notes (Signed)
Medical screening examination/treatment/procedure(s) were performed by non-physician practitioner and as supervising physician I was immediately available for consultation/collaboration.   Carleene Cooper III, MD 12/16/12 2035

## 2012-12-16 NOTE — ED Provider Notes (Signed)
History     CSN: [id number]  Arrival date & time 12/16/12  0904   First MD Initiated Contact with Patient 12/16/12 [phone]      Chief Complaint  Patient presents with  . Back Pain    (Consider location/radiation/quality/duration/timing/severity/associated sxs/prior treatment) HPI  Rita Newton is a 57 y.o. female complaining of exacerbation of chronic low back pain status post slip and fall last night. Patient denies any loss of bowel or bladder function, numbness paresthesia she endorses a difficulty walking but is able to ambulate. 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 Patient has prior back surgery to the lumbar area.    Past Medical History  Diagnosis Date  . Migraine   . Chronic pain   . Hypertension   . Depression     Past Surgical History  Procedure Date  . Back surgery     No family history on file.  History  Substance Use Topics  . Smoking status: Current Every Day Smoker  . Smokeless tobacco: Not on file  . Alcohol Use: No    OB History    Grav Para Term Preterm Abortions TAB SAB Ect Mult Living                  Review of Systems  Constitutional: Negative for fever.  HENT: Negative for neck pain.   Gastrointestinal: Negative for nausea, vomiting and abdominal pain.  Genitourinary: Negative for dysuria and difficulty urinating.  Musculoskeletal: Positive for back pain.  Neurological: Negative for weakness and numbness.    Allergies  Other  Home Medications   Current Outpatient Rx  Name  Route  Sig  Dispense  Refill  . DULOXETINE HCL 60 MG PO CPEP   Oral   Take 120 mg by mouth daily.         Marland Kitchen GABAPENTIN 800 MG PO TABS   Oral   Take 800 mg by mouth 3 (three) times daily.          Marland Kitchen LISINOPRIL 10 MG PO TABS   Oral   Take 10 mg by mouth 2 (two) times daily.         Marland Kitchen HYDROCODONE-ACETAMINOPHEN 5-500 MG PO TABS   Oral   Take 1-2 tablets by mouth every 6 (six) hours as needed for pain.   6 tablet   0     BP 135/106  Pulse 92  Temp 97 F (36.1 C) (Oral)  Resp 20  SpO2 97%  Physical Exam  Nursing note and vitals reviewed. Constitutional: She is oriented to person, place, and time. She appears well-developed and well-nourished. No distress.  HENT:  Head: Normocephalic.  Eyes: Conjunctivae normal and EOM are normal. Pupils are equal, round, and reactive to light.  Cardiovascular: Normal rate.   Pulmonary/Chest: Effort normal. No stridor.  Abdominal: Soft. Bowel sounds are normal.  Musculoskeletal: Normal range of motion.       Thoracic back: She exhibits no tenderness, no bony tenderness, no swelling, no edema, no deformity and no pain.       Lumbar back: She exhibits no tenderness, no bony tenderness, no swelling, no edema, no deformity, no laceration and no pain.       Drink is 5 out of 5x4 extremities, distal sensation is grossly intact. Patient ambulates with a coordinated gait. No midline tenderness to palpation or step-offs to the lumbar spine.  Neurological: She is alert and oriented to person, place, and time.  Psychiatric: She has a normal mood  and affect.    ED Course  Procedures (including critical care time)  Labs Reviewed - No data to display Dg Lumbar Spine Complete  12/16/2012  *RADIOLOGY REPORT*  Clinical Data: Larey Seat.  Back pain.  LUMBAR SPINE - COMPLETE 4+ VIEW  Comparison: 02/17/2011.  Findings: Stable degenerative lumbar spondylosis with disc disease and facet disease.  The overall alignment is maintained.  No acute bony findings.  The facets are normally aligned.  The visualized bony pelvic structures are intact.  IMPRESSION:  1.  Stable degenerative lumbar spondylosis. 2.  No acute bony findings.   Original Report Authenticated By: Rudie Meyer, M.D.      1. Acute exacerbation of chronic low back pain       MDM  Plain films show no acute abnormalities. Physical exam is normal.   Pt verbalized understanding and agrees with care plan. Outpatient follow-up  and return precautions given.    New Prescriptions   HYDROCODONE-ACETAMINOPHEN (VICODIN) 5-500 MG PER TABLET    Take 1-2 tablets by mouth every 6 (six) hours as needed for pain.          Wynetta Emery, PA-C 12/16/12 1047

## 2013-02-27 ENCOUNTER — Encounter (HOSPITAL_COMMUNITY): Payer: Self-pay | Admitting: *Deleted

## 2013-02-27 ENCOUNTER — Emergency Department (HOSPITAL_COMMUNITY)
Admission: EM | Admit: 2013-02-27 | Discharge: 2013-02-27 | Disposition: A | Discharge status: Home / Self Care | Payer: Medicaid Other | Attending: Emergency Medicine | Admitting: Emergency Medicine

## 2013-02-27 DIAGNOSIS — Z9889 Other specified postprocedural states: Secondary | ICD-10-CM | POA: Insufficient documentation

## 2013-02-27 DIAGNOSIS — F329 Major depressive disorder, single episode, unspecified: Secondary | ICD-10-CM | POA: Insufficient documentation

## 2013-02-27 DIAGNOSIS — G8929 Other chronic pain: Secondary | ICD-10-CM | POA: Insufficient documentation

## 2013-02-27 DIAGNOSIS — IMO0002 Reserved for concepts with insufficient information to code with codable children: Secondary | ICD-10-CM | POA: Insufficient documentation

## 2013-02-27 DIAGNOSIS — I10 Essential (primary) hypertension: Secondary | ICD-10-CM | POA: Insufficient documentation

## 2013-02-27 DIAGNOSIS — Z79899 Other long term (current) drug therapy: Secondary | ICD-10-CM | POA: Insufficient documentation

## 2013-02-27 DIAGNOSIS — M5416 Radiculopathy, lumbar region: Secondary | ICD-10-CM

## 2013-02-27 DIAGNOSIS — Z8679 Personal history of other diseases of the circulatory system: Secondary | ICD-10-CM | POA: Insufficient documentation

## 2013-02-27 DIAGNOSIS — F17200 Nicotine dependence, unspecified, uncomplicated: Secondary | ICD-10-CM | POA: Insufficient documentation

## 2013-02-27 DIAGNOSIS — F3289 Other specified depressive episodes: Secondary | ICD-10-CM | POA: Insufficient documentation

## 2013-02-27 MED ORDER — ONDANSETRON 4 MG PO TBDP
4.0000 mg | ORAL_TABLET | Freq: Once | ORAL | Status: AC
  Administered 2013-02-27: 4 mg via ORAL
  Filled 2013-02-27: qty 1

## 2013-02-27 MED ORDER — HYDROMORPHONE HCL PF 1 MG/ML IJ SOLN
1.0000 mg | Freq: Once | INTRAMUSCULAR | Status: AC
  Administered 2013-02-27: 1 mg via INTRAMUSCULAR
  Filled 2013-02-27: qty 1

## 2013-02-27 MED ORDER — METHYLPREDNISOLONE 4 MG PO KIT: PACK | ORAL | Status: DC

## 2013-02-27 MED ORDER — OXYCODONE-ACETAMINOPHEN 5-325 MG PO TABS: ORAL_TABLET | ORAL | Status: DC

## 2013-02-27 NOTE — ED Notes (Signed)
Pt c/o back pain and L leg numbness for several years.  She was told several months ago by a neurologist that there is nothing more that he can do for her.  However, pt cannot live with the pain, and thus she is here.

## 2013-02-27 NOTE — ED Notes (Signed)
Pt reports pain in lower back and left leg.  Pt has 6 yr hx of same.  Pt states her neurologist told her there was nothing else he could do for her.   Pt has had surgery 3 yrs ago for same problem and thinks the surgeon damaged the nerve but neurologist says there is no nerve damage and she should "walk it off".  Pt has not seen neurologist in "months" and takes no pain medication on regular basis. Pt states she cannot live with the pain.  Pt reports pain 10/10 and left left numbness.  Pain comes and goes.  Pt has taken bc powder and ibuprofen without relief.  Pt alert oriented X4

## 2013-02-27 NOTE — ED Provider Notes (Signed)
History     CSN: [id number]  Arrival date & time 02/27/13  1203   First MD Initiated Contact with Patient 02/27/13 1219      Chief Complaint  Patient presents with  . Back Pain    (Consider location/radiation/quality/duration/timing/severity/associated sxs/prior treatment) HPI  Rita Newton is a 57 y.o. female complaining of exacerbation of chronic low back pain. Patient states she has surgery by Dr. Lenon Ahmadi 4 years ago she's had issues ever since that time with pain, weakness and numbness to the posterior left thigh. Patient states that these symptoms have been becoming progressively worse. She denies fever, perineal numbness, weakness, change in bowel or bladder habits, history of IV drug use, history of cancer. Patient states that she cannot get into see her primary care physician due to the dohnut period. Rates her pain at 10 out of 10, exacerbated by certain positions and weightbearing. She's been taking ibuprofen and BC powder with no relief.    Past Medical History  Diagnosis Date  . Migraine   . Chronic pain   . Hypertension   . Depression     Past Surgical History  Procedure Laterality Date  . Back surgery    . Abdominal hysterectomy    . Bladder repair      No family history on file.  History  Substance Use Topics  . Smoking status: Current Every Day Smoker -- 0.50 packs/day    Types: Cigarettes  . Smokeless tobacco: Not on file  . Alcohol Use: No    OB History   Grav Para Term Preterm Abortions TAB SAB Ect Mult Living                  Review of Systems  Constitutional: Negative for fever.  HENT: Negative for neck pain.   Gastrointestinal: Negative for nausea, vomiting and abdominal pain.  Genitourinary: Negative for dysuria and difficulty urinating.  Musculoskeletal: Positive for back pain.  Neurological: Negative for weakness and numbness.    Allergies  Other  Home Medications   Current Outpatient Rx  Name  Route  Sig  Dispense   Refill  . Aspirin-Salicylamide-Caffeine (BC HEADACHE POWDER PO)   Oral   Take 1 packet by mouth 5 (five) times daily as needed. For pain         . cholecalciferol (VITAMIN D-400) 400 UNITS TABS   Oral   Take 400 Units by mouth daily.         . DULoxetine (CYMBALTA) 60 MG capsule   Oral   Take 120 mg by mouth daily.         Marland Kitchen gabapentin (NEURONTIN) 800 MG tablet   Oral   Take 800 mg by mouth 3 (three) times daily.          Marland Kitchen ibuprofen (ADVIL,MOTRIN) 200 MG tablet   Oral   Take 600-800 mg by mouth daily as needed for pain. For pain         . lisinopril (PRINIVIL,ZESTRIL) 10 MG tablet   Oral   Take 10 mg by mouth 2 (two) times daily.           BP 121/98  Pulse 94  Temp(Src) 97.9 F (36.6 C) (Oral)  Resp 18  SpO2 97%  Physical Exam  Nursing note and vitals reviewed. Constitutional: She is oriented to person, place, and time. She appears well-developed and well-nourished. No distress.  Acutely agitated, crying.  HENT:  Head: Normocephalic.  Eyes: Conjunctivae and EOM are normal. Pupils are equal,  round, and reactive to light.  Cardiovascular: Normal rate and intact distal pulses.   Pulmonary/Chest: Effort normal and breath sounds normal. No stridor. No respiratory distress. She has no wheezes. She has no rales. She exhibits no tenderness.  Abdominal: Soft. Bowel sounds are normal. She exhibits no distension and no mass. There is no tenderness. There is no rebound and no guarding.  Musculoskeletal: Normal range of motion.  Neurological: She is alert and oriented to person, place, and time.  Strength is 5 out of 5x4 extremities, distal sensation is grossly intact. Patient is able to ambulate  Psychiatric: She has a normal mood and affect.    ED Course  Procedures (including critical care time)  Labs Reviewed - No data to display No results found.   1. Lumbar radicular pain       MDM   Rita Newton is a 57 y.o. female exacerbation of chronic low  back pain. No red flags.  Patient with back pain.  No neurological deficits and normal neuro exam.  Patient can walk but states is painful.  No loss of bowel or bladder control.  No concern for cauda equina.  No fever, night sweats, weight loss, h/o cancer, IVDU.    Filed Vitals:   02/27/13 1209  BP: 121/98  Pulse: 94  Temp: 97.9 F (36.6 C)  TempSrc: Oral  Resp: 18  SpO2: 97%     Pt verbalized understanding and agrees with care plan. Outpatient follow-up and return precautions given.    Discharge Medication List as of 02/27/2013  1:44 PM    START taking these medications   Details  methylPREDNISolone (MEDROL DOSEPAK) 4 MG tablet As directed by package insert, Print    oxyCODONE-acetaminophen (PERCOCET/ROXICET) 5-325 MG per tablet 1 to 2 tabs PO q6hrs  PRN for pain, Print               Wynetta Emery, PA-C 02/27/13 1704

## 2013-02-27 NOTE — ED Notes (Addendum)
Pt requesting pain meds prior to seeing MD.  Pt notified that RN cannot RX pain medication.  RN repositioned patient and gave blankets to position knees.  Pt is upset that she has not seen md or been given pain medication since she came to ED.  RN notes that pt arrived at 12:08

## 2013-02-27 NOTE — ED Notes (Signed)
Pt ambulated without assistance or difficulty

## 2013-03-01 NOTE — ED Provider Notes (Signed)
Medical screening examination/treatment/procedure(s) were performed by non-physician practitioner and as supervising physician I was immediately available for consultation/collaboration.   Laray Anger, DO 03/01/13 1210

## 2013-03-21 ENCOUNTER — Emergency Department (HOSPITAL_COMMUNITY)
Admission: EM | Admit: 2013-03-21 | Discharge: 2013-03-21 | Disposition: A | Discharge status: Home / Self Care | Payer: Medicaid Other | Attending: Emergency Medicine | Admitting: Emergency Medicine

## 2013-03-21 ENCOUNTER — Encounter (HOSPITAL_COMMUNITY): Payer: Self-pay | Admitting: *Deleted

## 2013-03-21 DIAGNOSIS — F17200 Nicotine dependence, unspecified, uncomplicated: Secondary | ICD-10-CM | POA: Insufficient documentation

## 2013-03-21 DIAGNOSIS — M549 Dorsalgia, unspecified: Secondary | ICD-10-CM

## 2013-03-21 DIAGNOSIS — G8929 Other chronic pain: Secondary | ICD-10-CM | POA: Insufficient documentation

## 2013-03-21 DIAGNOSIS — G43909 Migraine, unspecified, not intractable, without status migrainosus: Secondary | ICD-10-CM | POA: Insufficient documentation

## 2013-03-21 DIAGNOSIS — F3289 Other specified depressive episodes: Secondary | ICD-10-CM | POA: Insufficient documentation

## 2013-03-21 DIAGNOSIS — I10 Essential (primary) hypertension: Secondary | ICD-10-CM | POA: Insufficient documentation

## 2013-03-21 DIAGNOSIS — Z79899 Other long term (current) drug therapy: Secondary | ICD-10-CM | POA: Insufficient documentation

## 2013-03-21 DIAGNOSIS — R209 Unspecified disturbances of skin sensation: Secondary | ICD-10-CM | POA: Insufficient documentation

## 2013-03-21 DIAGNOSIS — F329 Major depressive disorder, single episode, unspecified: Secondary | ICD-10-CM | POA: Insufficient documentation

## 2013-03-21 MED ORDER — OXYCODONE-ACETAMINOPHEN 5-325 MG PO TABS
2.0000 | ORAL_TABLET | Freq: Once | ORAL | Status: AC
  Administered 2013-03-21: 2 via ORAL
  Filled 2013-03-21: qty 2

## 2013-03-21 MED ORDER — OXYCODONE-ACETAMINOPHEN 5-325 MG PO TABS: ORAL_TABLET | ORAL | Status: DC

## 2013-03-21 NOTE — ED Provider Notes (Signed)
Medical screening examination/treatment/procedure(s) were performed by non-physician practitioner and as supervising physician I was immediately available for consultation/collaboration.  Flint Melter, MD 03/21/13 2203

## 2013-03-21 NOTE — ED Notes (Signed)
Pt brought back via wheelchair with family in tow; pt getting undressed and into a gown at this time; warm blanket given

## 2013-03-21 NOTE — ED Provider Notes (Signed)
History     CSN: [id number]  Arrival date & time 03/21/13  1104   First MD Initiated Contact with Patient 03/21/13 1154      Chief Complaint  Patient presents with  . Back Pain    (Consider location/radiation/quality/duration/timing/severity/associated sxs/prior treatment) HPI   Rita Newton is a 57 y.o. female  history of chronic back pain and surgery presents with 2 months of worsening pain and numbness to her lower extremities. Pt states that 3 years ago she was told her had a fluid filled cyst on her nerve which was surgically repaired. Since then she has had some chronic back pain which was controlled with ibuprofen and Vicodin. For the past 2 months she has had increasing pain especially on the left side that is constant and worse with movement which radiates down into her feet. She has also been experiencing some numbness to her posterior thigh on the left side. She states that she also has had some pain between her shoulder blade and numbness in her hands which she states is also new in the past 2 months. She describes muscle spasms in her back and sometimes causes her to have a jerking of her arms and legs. She denies changes in vision, HA, recent illness, weakness, fever, N/V, SOB, Chest pain, rash, history of IV drug use or cancer, change in bowel or bladder habits.   Past Medical History  Diagnosis Date  . Migraine   . Chronic pain   . Hypertension   . Depression     Past Surgical History  Procedure Laterality Date  . Back surgery    . Abdominal hysterectomy    . Bladder repair      History reviewed. No pertinent family history.  History  Substance Use Topics  . Smoking status: Current Every Day Smoker -- 0.50 packs/day    Types: Cigarettes  . Smokeless tobacco: Not on file  . Alcohol Use: No    OB History   Grav Para Term Preterm Abortions TAB SAB Ect Mult Living                  Review of Systems  Constitutional: Negative for fever.  HENT:  Negative for neck pain.   Respiratory: Negative for shortness of breath.   Cardiovascular: Negative for chest pain.  Gastrointestinal: Negative for nausea, vomiting, abdominal pain and diarrhea.  Genitourinary: Negative for dysuria and difficulty urinating.  Musculoskeletal: Positive for back pain.  Neurological: Negative for weakness and numbness.  All other systems reviewed and are negative.    Allergies  Other  Home Medications   Current Outpatient Rx  Name  Route  Sig  Dispense  Refill  . Aspirin-Salicylamide-Caffeine (BC HEADACHE POWDER PO)   Oral   Take 1 packet by mouth 5 (five) times daily as needed. For pain         . cholecalciferol (VITAMIN D-400) 400 UNITS TABS   Oral   Take 400 Units by mouth daily.         . DULoxetine (CYMBALTA) 60 MG capsule   Oral   Take 120 mg by mouth daily.         Marland Kitchen gabapentin (NEURONTIN) 800 MG tablet   Oral   Take 800 mg by mouth 3 (three) times daily.          Marland Kitchen ibuprofen (ADVIL,MOTRIN) 200 MG tablet   Oral   Take 600-800 mg by mouth daily as needed for pain. For pain         .  lisinopril (PRINIVIL,ZESTRIL) 10 MG tablet   Oral   Take 10 mg by mouth 2 (two) times daily.         . methylPREDNISolone (MEDROL DOSEPAK) 4 MG tablet      As directed by package insert   21 tablet   0   . oxyCODONE-acetaminophen (PERCOCET/ROXICET) 5-325 MG per tablet      1 to 2 tabs PO q6hrs  PRN for pain   15 tablet   0     BP 156/110  Pulse 84  Temp(Src) 98.6 F (37 C) (Oral)  Resp 16  SpO2 99%  Physical Exam  Nursing note and vitals reviewed. Constitutional: She is oriented to person, place, and time. She appears well-developed and well-nourished. No distress.  HENT:  Head: Normocephalic.  Eyes: Conjunctivae and EOM are normal. Pupils are equal, round, and reactive to light.  Neck: Normal range of motion.  Cardiovascular: Normal rate.   Pulmonary/Chest: Effort normal and breath sounds normal. No stridor. No  respiratory distress. She has no wheezes. She has no rales. She exhibits no tenderness.  Abdominal: Soft. Bowel sounds are normal. She exhibits no distension and no mass. There is no tenderness. There is no rebound and no guarding.  Musculoskeletal: Normal range of motion.  Neurological: She is alert and oriented to person, place, and time.  Follows commands, Goal oriented speech, Strength is 5 out of 5x4 extremities, patient ambulates with a coordinated in nonantalgic gait.   Sensation is intact to pinprick and light touch on the bilateral lower extremities with differentiation between pinprick and light touch on lateral, medial, anterior and posterior upper and lower legs.  No saddle paresthesia.  Psychiatric: She has a normal mood and affect.    ED Course  Procedures (including critical care time)  Labs Reviewed - No data to display No results found.   1. Chronic back pain       MDM   Rita Newton is a 57 y.o. female with exacerbation of chronic low back pain. Patient states that she has a numbness to the posterior thighs however this as not evidenced on my exam. No red flags, no concern for cauda equina, I doubt the patient to followup as an outpatient for her chronic low back pain. Return precautions are given.   Filed Vitals:   03/21/13 1127  BP: 156/110  Pulse: 84  Temp: 98.6 F (37 C)  TempSrc: Oral  Resp: 16  SpO2: 99%     Pt verbalized understanding and agrees with care plan. Outpatient follow-up and return precautions given.    Discharge Medication List as of 03/21/2013 12:57 PM    START taking these medications   Details  !! oxyCODONE-acetaminophen (PERCOCET/ROXICET) 5-325 MG per tablet 1 to 2 tabs PO q6hrs  PRN for pain, Print     !! - Potential duplicate medications found. Please discuss with provider.             Wynetta Emery, PA-C 03/21/13 1621

## 2013-03-21 NOTE — ED Notes (Signed)
Pt reports hx of back pain and surgery. Having pain and numbness sensation to left leg x 2 months. Ambulatory at triage.

## 2013-04-03 ENCOUNTER — Other Ambulatory Visit: Payer: Self-pay | Admitting: Orthopedic Surgery

## 2013-04-03 DIAGNOSIS — M545 Low back pain: Secondary | ICD-10-CM

## 2013-04-08 ENCOUNTER — Ambulatory Visit
Admission: RE | Admit: 2013-04-08 | Discharge: 2013-04-08 | Disposition: A | Discharge status: Home / Self Care | Payer: Medicaid Other | Source: Ambulatory Visit | Attending: Orthopedic Surgery | Admitting: Orthopedic Surgery

## 2013-04-08 DIAGNOSIS — M545 Low back pain: Secondary | ICD-10-CM

## 2013-04-23 ENCOUNTER — Other Ambulatory Visit: Payer: Self-pay | Admitting: Orthopedic Surgery

## 2013-04-23 DIAGNOSIS — M545 Low back pain: Secondary | ICD-10-CM

## 2013-04-24 ENCOUNTER — Other Ambulatory Visit: Payer: Medicaid Other

## 2013-07-28 ENCOUNTER — Emergency Department (HOSPITAL_COMMUNITY)
Admission: EM | Admit: 2013-07-28 | Discharge: 2013-07-28 | Disposition: A | Discharge status: Home / Self Care | Payer: Medicaid Other | Attending: Emergency Medicine | Admitting: Emergency Medicine

## 2013-07-28 ENCOUNTER — Encounter (HOSPITAL_COMMUNITY): Payer: Self-pay | Admitting: Nurse Practitioner

## 2013-07-28 DIAGNOSIS — F329 Major depressive disorder, single episode, unspecified: Secondary | ICD-10-CM | POA: Insufficient documentation

## 2013-07-28 DIAGNOSIS — W19XXXA Unspecified fall, initial encounter: Secondary | ICD-10-CM | POA: Insufficient documentation

## 2013-07-28 DIAGNOSIS — G8929 Other chronic pain: Secondary | ICD-10-CM | POA: Insufficient documentation

## 2013-07-28 DIAGNOSIS — Z9889 Other specified postprocedural states: Secondary | ICD-10-CM | POA: Insufficient documentation

## 2013-07-28 DIAGNOSIS — Z79899 Other long term (current) drug therapy: Secondary | ICD-10-CM | POA: Insufficient documentation

## 2013-07-28 DIAGNOSIS — G43909 Migraine, unspecified, not intractable, without status migrainosus: Secondary | ICD-10-CM | POA: Insufficient documentation

## 2013-07-28 DIAGNOSIS — F17200 Nicotine dependence, unspecified, uncomplicated: Secondary | ICD-10-CM | POA: Insufficient documentation

## 2013-07-28 DIAGNOSIS — Y929 Unspecified place or not applicable: Secondary | ICD-10-CM | POA: Insufficient documentation

## 2013-07-28 DIAGNOSIS — Y939 Activity, unspecified: Secondary | ICD-10-CM | POA: Insufficient documentation

## 2013-07-28 DIAGNOSIS — F3289 Other specified depressive episodes: Secondary | ICD-10-CM | POA: Insufficient documentation

## 2013-07-28 DIAGNOSIS — M5416 Radiculopathy, lumbar region: Secondary | ICD-10-CM

## 2013-07-28 DIAGNOSIS — I10 Essential (primary) hypertension: Secondary | ICD-10-CM | POA: Insufficient documentation

## 2013-07-28 DIAGNOSIS — IMO0002 Reserved for concepts with insufficient information to code with codable children: Secondary | ICD-10-CM | POA: Insufficient documentation

## 2013-07-28 MED ORDER — OXYCODONE-ACETAMINOPHEN 5-325 MG PO TABS: 2.0000 | ORAL_TABLET | ORAL | Status: DC | PRN

## 2013-07-28 MED ORDER — OXYCODONE-ACETAMINOPHEN 5-325 MG PO TABS
1.0000 | ORAL_TABLET | Freq: Once | ORAL | Status: AC
  Administered 2013-07-28: 1 via ORAL
  Filled 2013-07-28: qty 1

## 2013-07-28 NOTE — ED Provider Notes (Signed)
CSN: [id number]     Arrival date & time 07/28/13  1045 History  This chart was scribed for non-physician practitioner Wynetta Emery, PA-C, working with Loren Racer, MD by Leone Payor, ED Scribe. This patient was seen in room TR07C/TR07C and the patient's care was started at 1045.    Chief Complaint  Patient presents with  . Back Pain    The history is provided by the patient. No language interpreter was used.   HPI Comments: Rita Newton is a 57 y.o. female with past medical history of sciatica who presents to the Emergency Department complaining of a fall that occurred yesterday. She now complains of constant, unchanged back pain that radiates down the left leg. She describes this pain as burning and rates the pain as 8/10. She denies any head injury or LOC. She reports taking 2 BC powders this morning with minimal relief. She states sitting and laying down worsens the pain. She denies neck pain, CP, SOB, fever, change in bowel or bladder function, numbness, weakness.   Past Medical History  Diagnosis Date  . Migraine   . Chronic pain   . Hypertension   . Depression    Past Surgical History  Procedure Laterality Date  . Back surgery    . Abdominal hysterectomy    . Bladder repair     History reviewed. No pertinent family history. History  Substance Use Topics  . Smoking status: Current Every Day Smoker -- 0.50 packs/day    Types: Cigarettes  . Smokeless tobacco: Not on file  . Alcohol Use: No   OB History   Grav Para Term Preterm Abortions TAB SAB Ect Mult Living                 Review of Systems A complete 10 system review of systems was obtained and all systems are negative except as noted in the HPI and PMH.   Allergies  Other  Home Medications   Current Outpatient Rx  Name  Route  Sig  Dispense  Refill  . Aspirin-Salicylamide-Caffeine (BC HEADACHE POWDER PO)   Oral   Take 1 packet by mouth 5 (five) times daily as needed. For pain         .  cholecalciferol (VITAMIN D-400) 400 UNITS TABS   Oral   Take 400 Units by mouth daily.         . DULoxetine (CYMBALTA) 60 MG capsule   Oral   Take 120 mg by mouth daily.         Marland Kitchen gabapentin (NEURONTIN) 800 MG tablet   Oral   Take 800 mg by mouth 3 (three) times daily.          Marland Kitchen ibuprofen (ADVIL,MOTRIN) 200 MG tablet   Oral   Take 600-800 mg by mouth daily as needed for pain. For pain         . lisinopril (PRINIVIL,ZESTRIL) 10 MG tablet   Oral   Take 10 mg by mouth 2 (two) times daily.         Marland Kitchen oxyCODONE-acetaminophen (PERCOCET/ROXICET) 5-325 MG per tablet      1 to 2 tabs PO q6hrs  PRN for pain   10 tablet   0    BP 139/104  Pulse 80  Temp(Src) 97.1 F (36.2 C) (Oral)  Resp 16  SpO2 97% Physical Exam  Nursing note and vitals reviewed. Constitutional: She is oriented to person, place, and time. She appears well-developed and well-nourished. No distress.  HENT:  Head: Normocephalic.  Eyes: Conjunctivae and EOM are normal.  Cardiovascular: Normal rate.   Pulmonary/Chest: Effort normal. No stridor.  Musculoskeletal: Normal range of motion.  Neurological: She is alert and oriented to person, place, and time.  Strength is 5 out of 5 to bilateral lower extremities at hip and knee, extensor hallucis longus 5 out of 5. Ankle strength 5 out of 5, no clonus, neurovascularly intact.   Psychiatric: She has a normal mood and affect.    ED Course  Procedures (including critical care time) DIAGNOSTIC STUDIES: Oxygen Saturation is 96% on RA, adequate by my interpretation.    COORDINATION OF CARE: 3:19 PM Discussed treatment plan with pt at bedside and pt agreed to plan.   Labs Review Labs Reviewed - No data to display Imaging Review No results found.  MDM   1. Lumbar radicular pain     Filed Vitals:   07/28/13 1102 07/28/13 1506  BP: 152/97 139/104  Pulse: 105 80  Temp: 97.7 F (36.5 C) 97.1 F (36.2 C)  TempSrc: Oral Oral  Resp: 20 16  SpO2: 96%  97%     ARRYANA Newton is a 57 y.o. female Patient with back pain.  No neurological deficits and normal neuro exam.  Patient can walk but states is painful.  No loss of bowel or bladder control.  No concern for cauda equina.  No fever, night sweats, weight loss, h/o cancer, IVDU.  RICE protocol and pain medicine indicated and discussed with patient.    Medications  oxyCODONE-acetaminophen (PERCOCET/ROXICET) 5-325 MG per tablet 1 tablet (1 tablet Oral Given 07/28/13 1526)    Pt is hemodynamically stable, appropriate for, and amenable to discharge at this time. Pt verbalized understanding and agrees with care plan. All questions answered. Outpatient follow-up and specific return precautions discussed.    Discharge Medication List as of 07/28/2013  3:20 PM    START taking these medications   Details  !! oxyCODONE-acetaminophen (PERCOCET/ROXICET) 5-325 MG per tablet Take 2 tablets by mouth every 4 (four) hours as needed for pain., Starting 07/28/2013, Until Discontinued, Print     !! - Potential duplicate medications found. Please discuss with provider.      Note: Portions of this report may have been transcribed using voice recognition software. Every effort was made to ensure accuracy; however, inadvertent computerized transcription errors may be present     Wynetta Emery, PA-C 07/30/13 1944

## 2013-07-28 NOTE — ED Notes (Addendum)
Hx chronic back pain but worse after a fall yesterday. C/o severe lower back pain radiating into bnilateral hips and down L leg. Took motrin with no relief

## 2013-07-28 NOTE — ED Notes (Signed)
Pt fell yesterday causing increase in CBP. Hx of back sx per Dr. Gerlene Fee 5 years ago. States pain radiates down left leg. Has pre-existing left leg weakness. States she called her PCP but she is no longer with the clinic.

## 2013-07-31 NOTE — ED Provider Notes (Signed)
Medical screening examination/treatment/procedure(s) were performed by non-physician practitioner and as supervising physician I was immediately available for consultation/collaboration.   Verlyn Lambert, MD 07/31/13 1540 

## 2013-08-13 ENCOUNTER — Emergency Department (HOSPITAL_COMMUNITY): Payer: Medicaid Other

## 2013-08-13 ENCOUNTER — Emergency Department (HOSPITAL_COMMUNITY)
Admission: EM | Admit: 2013-08-13 | Discharge: 2013-08-13 | Disposition: A | Discharge status: Home / Self Care | Payer: Medicaid Other | Attending: Emergency Medicine | Admitting: Emergency Medicine

## 2013-08-13 ENCOUNTER — Encounter (HOSPITAL_COMMUNITY): Payer: Self-pay | Admitting: Physical Medicine and Rehabilitation

## 2013-08-13 DIAGNOSIS — Z79899 Other long term (current) drug therapy: Secondary | ICD-10-CM | POA: Insufficient documentation

## 2013-08-13 DIAGNOSIS — G8929 Other chronic pain: Secondary | ICD-10-CM | POA: Insufficient documentation

## 2013-08-13 DIAGNOSIS — I10 Essential (primary) hypertension: Secondary | ICD-10-CM | POA: Insufficient documentation

## 2013-08-13 DIAGNOSIS — F17200 Nicotine dependence, unspecified, uncomplicated: Secondary | ICD-10-CM | POA: Insufficient documentation

## 2013-08-13 DIAGNOSIS — G5632 Lesion of radial nerve, left upper limb: Secondary | ICD-10-CM

## 2013-08-13 DIAGNOSIS — Z8669 Personal history of other diseases of the nervous system and sense organs: Secondary | ICD-10-CM | POA: Insufficient documentation

## 2013-08-13 DIAGNOSIS — G5630 Lesion of radial nerve, unspecified upper limb: Secondary | ICD-10-CM | POA: Insufficient documentation

## 2013-08-13 DIAGNOSIS — M545 Low back pain, unspecified: Secondary | ICD-10-CM | POA: Insufficient documentation

## 2013-08-13 DIAGNOSIS — F3289 Other specified depressive episodes: Secondary | ICD-10-CM | POA: Insufficient documentation

## 2013-08-13 DIAGNOSIS — F329 Major depressive disorder, single episode, unspecified: Secondary | ICD-10-CM | POA: Insufficient documentation

## 2013-08-13 LAB — CBC WITH DIFFERENTIAL/PLATELET
Eosinophils Relative: 4 % (ref 0–5)
MCH: 33.5 pg (ref 26.0–34.0)
MCHC: 34.3 g/dL (ref 30.0–36.0)
MCV: 97.6 fL (ref 78.0–100.0)
Neutrophils Relative %: 60 % (ref 43–77)
Platelets: 283 10*3/uL (ref 150–400)
RDW: 12.5 % (ref 11.5–15.5)

## 2013-08-13 LAB — COMPREHENSIVE METABOLIC PANEL
Alkaline Phosphatase: 116 U/L (ref 39–117)
BUN: 8 mg/dL (ref 6–23)
CO2: 29 mEq/L (ref 19–32)
Chloride: 100 mEq/L (ref 96–112)
Creatinine, Ser: 0.68 mg/dL (ref 0.50–1.10)
GFR calc non Af Amer: >90 mL/min (ref >90)
Total Bilirubin: 0.2 mg/dL — ABNORMAL LOW (ref 0.3–1.2)

## 2013-08-13 MED ORDER — OXYCODONE-ACETAMINOPHEN 5-325 MG PO TABS
2.0000 | ORAL_TABLET | Freq: Once | ORAL | Status: AC
  Administered 2013-08-13: 2 via ORAL
  Filled 2013-08-13: qty 2

## 2013-08-13 MED ORDER — DIAZEPAM 5 MG PO TABS
5.0000 mg | ORAL_TABLET | Freq: Once | ORAL | Status: AC
  Administered 2013-08-13: 5 mg via ORAL
  Filled 2013-08-13: qty 1

## 2013-08-13 MED ORDER — KETOROLAC TROMETHAMINE 60 MG/2ML IM SOLN
60.0000 mg | Freq: Once | INTRAMUSCULAR | Status: AC
  Administered 2013-08-13: 60 mg via INTRAMUSCULAR
  Filled 2013-08-13: qty 2

## 2013-08-13 MED ORDER — OXYCODONE-ACETAMINOPHEN 5-325 MG PO TABS: 2.0000 | ORAL_TABLET | ORAL | Status: DC | PRN

## 2013-08-13 NOTE — ED Provider Notes (Signed)
CSN: [id number]     Arrival date & time 08/13/13  0957 History   First MD Initiated Contact with Patient 08/13/13 1108     Chief Complaint  Patient presents with  . Extremity Weakness   (Consider location/radiation/quality/duration/timing/severity/associated sxs/prior Treatment) HPI Comments: Patient is a 57 year old female with a past medical history of hypertensions and depression who presents with left hand weakness for the past 3 days. Symptoms started gradually and remained constant since the onset. Patient reports injuring her left elbow a few weeks ago and is concerned that may have something to do with her weakness since her elbow still hurts. Patient has not tried anything to improve her symptoms. She states she has experienced difficulty when doing household chores. She has never experienced this previously. No other associated symptoms. She denies any numbness. No aggravating/alleviaitng factors.    Past Medical History  Diagnosis Date  . Migraine   . Chronic pain   . Hypertension   . Depression    Past Surgical History  Procedure Laterality Date  . Back surgery    . Abdominal hysterectomy    . Bladder repair     No family history on file. History  Substance Use Topics  . Smoking status: Current Every Day Smoker -- 0.50 packs/day    Types: Cigarettes  . Smokeless tobacco: Not on file  . Alcohol Use: No   OB History   Grav Para Term Preterm Abortions TAB SAB Ect Mult Living                 Review of Systems  Musculoskeletal: Positive for arthralgias.  Neurological: Positive for weakness.  All other systems reviewed and are negative.    Allergies  Other  Home Medications   Current Outpatient Rx  Name  Route  Sig  Dispense  Refill  . Aspirin-Salicylamide-Caffeine (BC HEADACHE POWDER PO)   Oral   Take 1 packet by mouth 5 (five) times daily as needed. For pain         . cholecalciferol (VITAMIN D-400) 400 UNITS TABS   Oral   Take 400 Units by mouth  daily.         . DULoxetine (CYMBALTA) 60 MG capsule   Oral   Take 120 mg by mouth daily.         Marland Kitchen gabapentin (NEURONTIN) 800 MG tablet   Oral   Take 800 mg by mouth 3 (three) times daily.          Marland Kitchen ibuprofen (ADVIL,MOTRIN) 200 MG tablet   Oral   Take 600-800 mg by mouth daily as needed for pain. For pain         . lisinopril (PRINIVIL,ZESTRIL) 10 MG tablet   Oral   Take 10 mg by mouth 2 (two) times daily.         Marland Kitchen oxyCODONE-acetaminophen (PERCOCET/ROXICET) 5-325 MG per tablet      1 to 2 tabs PO q6hrs  PRN for pain   10 tablet   0    BP 131/84  Pulse 92  Temp(Src) 97.6 F (36.4 C) (Oral)  Resp 18  SpO2 96% Physical Exam  Nursing note and vitals reviewed. Constitutional: She is oriented to person, place, and time. She appears well-developed and well-nourished. No distress.  HENT:  Head: Normocephalic and atraumatic.  Nose: Nose normal.  Mouth/Throat: Oropharynx is clear and moist. No oropharyngeal exudate.  Eyes: Conjunctivae and EOM are normal. Pupils are equal, round, and reactive to light. No scleral  icterus.  Neck: Normal range of motion.  Cardiovascular: Normal rate and regular rhythm.  Exam reveals no gallop and no friction rub.   No murmur heard. Pulmonary/Chest: Effort normal and breath sounds normal. She has no wheezes. She has no rales. She exhibits no tenderness.  Abdominal: Soft. She exhibits no distension. There is no tenderness. There is no rebound.  Musculoskeletal: Normal range of motion.  Left wrist held in a flexed position due to weakness (wrist drop). No obvious deformity of left wrist or elbow. No tenderness to palpation.   Neurological: She is alert and oriented to person, place, and time. No cranial nerve deficit. Coordination normal.  Left grip strength diminished. Bilateral hand sensation intact and equal. Speech is goal-oriented. Moves limbs without ataxia.   Skin: Skin is warm and dry.  Psychiatric: She has a normal mood and  affect. Her behavior is normal.    ED Course  Procedures (including critical care time) Labs Review Labs Reviewed  COMPREHENSIVE METABOLIC PANEL - Abnormal; Notable for the following:    Total Bilirubin 0.2 (*)    All other components within normal limits  CBC WITH DIFFERENTIAL   Imaging Review Dg Elbow Complete Left  08/13/2013   *RADIOLOGY REPORT*  Clinical Data: Injured elbow 2 weeks ago with persistent posterior elbow pain and weakness, initial encounter.  LEFT ELBOW - COMPLETE 3+ VIEW  Comparison: None.  Findings: No fracture or dislocation.  No elbow joint effusion. Regional soft tissues are normal.  No radiopaque foreign body.  IMPRESSION:  No displaced fracture or elbow joint effusion.   Original Report Authenticated By: Tacey Ruiz, MD   Ct Head Wo Contrast  08/13/2013   CLINICAL DATA:  Left hand weakness, numbness  EXAM: CT HEAD WITHOUT CONTRAST  TECHNIQUE: Contiguous axial images were obtained from the base of the skull through the vertex without intravenous contrast.  COMPARISON:  6/14/ 13.  FINDINGS: No skull fracture is noted. Paranasal sinuses are unremarkable. Hypoplastic right mastoid air cells again noted. No air-fluid levels are noted.  No intracranial hemorrhage, mass effect or midline shift. The gray and white-matter differentiation is preserved. No acute cortical infarction. No mass lesion is noted on this unenhanced scan. No intra or extra-axial fluid collection.  IMPRESSION: No acute intracranial abnormality. No significant change.   Electronically Signed   By: Natasha Mead   On: 08/13/2013 13:09    MDM   1. Radial nerve palsy, left   2. Low back pain     11:29 AM Labs ordered by triage are unremarkable for acute change. Elbow xray pending. Patient will have head CT to rule out old infarct. Patient likely has radial nerve palsy.   1:52 PM Imaging and labs unremarkable. Patient likely has radial nerve palsy and will be referred to a Neurologist for evaluation and  physical therapy. No vascular compromise. Vitals stable and patient afebrile. Patient will have Percocet for lower back pain.     Emilia Beck, PA-C 08/13/13 1404

## 2013-08-13 NOTE — ED Provider Notes (Signed)
Medical screening examination/treatment/procedure(s) were performed by non-physician practitioner and as supervising physician I was immediately available for consultation/collaboration.   Roney Marion, MD 08/13/13 [id number]

## 2013-08-13 NOTE — ED Notes (Signed)
Pt presents to department for evaluation of L hand weakness. Onset Sunday. States weakness to hand, unable to make fist, unable to grip objects. Grip strength L weaker than R upon arrival to ED. Denies pain at the time. Speech clear. Pt is conscious alert and oriented x4.

## 2013-08-27 ENCOUNTER — Emergency Department (HOSPITAL_COMMUNITY)
Admission: EM | Admit: 2013-08-27 | Discharge: 2013-08-27 | Disposition: A | Discharge status: Home / Self Care | Payer: Medicaid Other | Attending: Emergency Medicine | Admitting: Emergency Medicine

## 2013-08-27 ENCOUNTER — Encounter (HOSPITAL_COMMUNITY): Payer: Self-pay | Admitting: Emergency Medicine

## 2013-08-27 DIAGNOSIS — M79609 Pain in unspecified limb: Secondary | ICD-10-CM | POA: Insufficient documentation

## 2013-08-27 DIAGNOSIS — Z79899 Other long term (current) drug therapy: Secondary | ICD-10-CM | POA: Insufficient documentation

## 2013-08-27 DIAGNOSIS — G43909 Migraine, unspecified, not intractable, without status migrainosus: Secondary | ICD-10-CM | POA: Insufficient documentation

## 2013-08-27 DIAGNOSIS — F329 Major depressive disorder, single episode, unspecified: Secondary | ICD-10-CM | POA: Insufficient documentation

## 2013-08-27 DIAGNOSIS — M5412 Radiculopathy, cervical region: Secondary | ICD-10-CM

## 2013-08-27 DIAGNOSIS — M25519 Pain in unspecified shoulder: Secondary | ICD-10-CM | POA: Insufficient documentation

## 2013-08-27 DIAGNOSIS — I10 Essential (primary) hypertension: Secondary | ICD-10-CM | POA: Insufficient documentation

## 2013-08-27 DIAGNOSIS — M545 Low back pain, unspecified: Secondary | ICD-10-CM | POA: Insufficient documentation

## 2013-08-27 DIAGNOSIS — F17200 Nicotine dependence, unspecified, uncomplicated: Secondary | ICD-10-CM | POA: Insufficient documentation

## 2013-08-27 DIAGNOSIS — M25529 Pain in unspecified elbow: Secondary | ICD-10-CM | POA: Insufficient documentation

## 2013-08-27 DIAGNOSIS — M5430 Sciatica, unspecified side: Secondary | ICD-10-CM | POA: Insufficient documentation

## 2013-08-27 DIAGNOSIS — F3289 Other specified depressive episodes: Secondary | ICD-10-CM | POA: Insufficient documentation

## 2013-08-27 DIAGNOSIS — G8929 Other chronic pain: Secondary | ICD-10-CM | POA: Insufficient documentation

## 2013-08-27 DIAGNOSIS — M5432 Sciatica, left side: Secondary | ICD-10-CM

## 2013-08-27 MED ORDER — IBUPROFEN 400 MG PO TABS
800.0000 mg | ORAL_TABLET | Freq: Once | ORAL | Status: AC
  Administered 2013-08-27: 800 mg via ORAL
  Filled 2013-08-27: qty 2

## 2013-08-27 MED ORDER — OXYCODONE-ACETAMINOPHEN 5-325 MG PO TABS: ORAL_TABLET | ORAL | Status: DC

## 2013-08-27 MED ORDER — OXYCODONE-ACETAMINOPHEN 5-325 MG PO TABS
2.0000 | ORAL_TABLET | Freq: Once | ORAL | Status: AC
  Administered 2013-08-27: 2 via ORAL
  Filled 2013-08-27: qty 2

## 2013-08-27 NOTE — ED Provider Notes (Signed)
CSN: [id number]     Arrival date & time 08/27/13  4098 History   This chart was scribed for non-physician practitioner Junius Finner, PA-C, working with Shon Baton, MD by Dorothey Baseman, ED Scribe. This patient was seen in room TR10C/TR10C and the patient's care was started at 10:55 AM.    Chief Complaint  Patient presents with  . Back Pain  . Elbow Pain   The history is provided by the patient. No language interpreter was used.   HPI Comments: Rita Newton is a 57 y.o. female who presents to the Emergency Department complaining of constant, severe back pain onset 4 days ago after the patient reports walking more than usual due to husband being in ICU.  Pt reports needing to walk more stairs than usual. Patient reports associated left arm pain and states that she cannot move it, right leg pain, and right shoulder pain that she describes as a burning for the past month. Patient reports that she was seen 2 weeks ago for similar complaints, but did not receive a definitive diagnosis and states that the symptoms persist.  Also reports having difficulty consulting referral, stating the number was for a [place] office and she cannot go all the way down there for tx. She reports taking ibuprofen and Advil at home without relief, but states that Percocet and Flexeril has been effective at treating her pain in the past. Patient reports a history of sciatica and that she had surgery for it around 6 years ago. Patient reports that she does not currently have a PCP. Patient denies any neck pain.   Past Medical History  Diagnosis Date  . Migraine   . Chronic pain   . Hypertension   . Depression    Past Surgical History  Procedure Laterality Date  . Back surgery    . Abdominal hysterectomy    . Bladder repair     History reviewed. No pertinent family history. History  Substance Use Topics  . Smoking status: Current Every Day Smoker -- 0.50 packs/day    Types: Cigarettes  . Smokeless tobacco:  Not on file  . Alcohol Use: No   OB History   Grav Para Term Preterm Abortions TAB SAB Ect Mult Living                 Review of Systems  Musculoskeletal: Positive for back pain and myalgias. Negative for gait problem and neck pain.    Allergies  Other  Home Medications   Current Outpatient Rx  Name  Route  Sig  Dispense  Refill  . Aspirin-Salicylamide-Caffeine (BC HEADACHE POWDER PO)   Oral   Take 1 packet by mouth 5 (five) times daily as needed. For pain         . cholecalciferol (VITAMIN D-400) 400 UNITS TABS   Oral   Take 400 Units by mouth daily.         . DULoxetine (CYMBALTA) 60 MG capsule   Oral   Take 120 mg by mouth daily.         Marland Kitchen gabapentin (NEURONTIN) 800 MG tablet   Oral   Take 800 mg by mouth 3 (three) times daily.          Marland Kitchen lisinopril (PRINIVIL,ZESTRIL) 10 MG tablet   Oral   Take 10 mg by mouth 2 (two) times daily.         Marland Kitchen oxyCODONE-acetaminophen (PERCOCET/ROXICET) 5-325 MG per tablet   Oral   Take 2 tablets by  mouth every 4 (four) hours as needed for pain.   12 tablet   0   . ibuprofen (ADVIL,MOTRIN) 200 MG tablet   Oral   Take 600-800 mg by mouth daily as needed for pain. For pain         . oxyCODONE-acetaminophen (PERCOCET/ROXICET) 5-325 MG per tablet      Take 1-2 pills every 4-6 hours as needed for pain.   15 tablet   0    Triage Vitals: BP 111/86  Pulse 98  Temp(Src) 98 F (36.7 C) (Oral)  Resp 18  Ht 5\' 4"  (1.626 m)  Wt 170 lb (77.111 kg)  BMI 29.17 kg/m2  SpO2 100%  Physical Exam  Nursing note and vitals reviewed. Constitutional: She is oriented to person, place, and time. She appears well-developed and well-nourished. She appears distressed.  Patient is tearful.  HENT:  Head: Normocephalic and atraumatic.  Eyes: EOM are normal.  Neck: Normal range of motion.  Cardiovascular: Normal rate, regular rhythm and normal heart sounds.   Pulmonary/Chest: Effort normal and breath sounds normal. No respiratory  distress.  Musculoskeletal: Normal range of motion.  Lumbar spinal and paraspinal tenderness to palpation over L3-L5 with a well-healed surgical scar. No cervical spinal tenderness to palpation. Antalgic gait. Tenderness to palpation over left upper trapezius. 4/5 left hand grip strength.  Neurological: She is alert and oriented to person, place, and time.  Skin: Skin is warm and dry.  Psychiatric: She has a normal mood and affect. Her behavior is normal.    ED Course  Procedures (including critical care time)  DIAGNOSTIC STUDIES: Oxygen Saturation is 100% on room air, normal by my interpretation.    COORDINATION OF CARE: 11:00AM- Will order Percocet and ibuprofen. Will discharge patient with Percocet to manage symptoms. Will refer patient to a PCP and to neurology for follow up. Discussed treatment plan with patient at bedside and patient verbalized agreement.     Labs Review Labs Reviewed - No data to display Imaging Review No results found.  MDM   1. Chronic low back pain   2. Sciatica neuralgia, left   3. Neuropathy, cervical (radicular)    Pt with chronic back pain presenting with exacerbation due to increased walking. Pt tearful, and anxious with husband being in ICU.  Denies recent fall or trauma.  Also c/o continued left hand pain and numbness.  Reviewed pt's previous notes from last visit.  Had normal head CT.  Advised pt I can Rx pain medication and will give another neurology referral and ensure it is in [place].  Discussed no need for further imaging at this time.  Rx: percocet.  Also provided info for [facility] and [facility].    I personally performed the services described in this documentation, which was scribed in my presence. The recorded information has been reviewed and is accurate.     Junius Finner, PA-C 08/27/13 1115

## 2013-08-27 NOTE — ED Notes (Signed)
Pt c/o pain in left elbow with radiation to left arm with trouble using hand due to pain; pt sts chronic lower back that has been worsened by increased walking due to her husband being in hospital; pt seen here 2 weeks ago for same; CMS intact

## 2013-08-27 NOTE — ED Provider Notes (Signed)
Medical screening examination/treatment/procedure(s) were performed by non-physician practitioner and as supervising physician I was immediately available for consultation/collaboration.  Jazzlin Clements F Onesha Krebbs, MD 08/27/13 2030 

## 2013-08-27 NOTE — ED Notes (Signed)
Pt c/o left wrist and arm pain. Here 2 weeks ago for same. States she was given a referral to "a Dr. In [place].Marland KitchenMarland KitchenI can't go there". Pt had referral to local Ortho MD.

## 2013-09-22 ENCOUNTER — Ambulatory Visit: Payer: Medicaid Other | Admitting: Neurology

## 2013-09-22 ENCOUNTER — Telehealth: Payer: Self-pay | Admitting: Neurology

## 2013-09-22 NOTE — Telephone Encounter (Signed)
This patient did not show for the appointment today. 

## 2014-04-16 ENCOUNTER — Emergency Department (HOSPITAL_COMMUNITY): Payer: Medicaid Other

## 2014-04-16 ENCOUNTER — Encounter (HOSPITAL_COMMUNITY): Payer: Self-pay | Admitting: Emergency Medicine

## 2014-04-16 ENCOUNTER — Emergency Department (HOSPITAL_COMMUNITY)
Admission: EM | Admit: 2014-04-16 | Discharge: 2014-04-16 | Discharge status: Against Medical Advice | Payer: Medicaid Other | Attending: Emergency Medicine | Admitting: Emergency Medicine

## 2014-04-16 DIAGNOSIS — W108XXA Fall (on) (from) other stairs and steps, initial encounter: Secondary | ICD-10-CM | POA: Insufficient documentation

## 2014-04-16 DIAGNOSIS — S79929A Unspecified injury of unspecified thigh, initial encounter: Secondary | ICD-10-CM

## 2014-04-16 DIAGNOSIS — IMO0002 Reserved for concepts with insufficient information to code with codable children: Secondary | ICD-10-CM | POA: Insufficient documentation

## 2014-04-16 DIAGNOSIS — F329 Major depressive disorder, single episode, unspecified: Secondary | ICD-10-CM | POA: Insufficient documentation

## 2014-04-16 DIAGNOSIS — F3289 Other specified depressive episodes: Secondary | ICD-10-CM | POA: Insufficient documentation

## 2014-04-16 DIAGNOSIS — W19XXXA Unspecified fall, initial encounter: Secondary | ICD-10-CM

## 2014-04-16 DIAGNOSIS — Z79899 Other long term (current) drug therapy: Secondary | ICD-10-CM | POA: Insufficient documentation

## 2014-04-16 DIAGNOSIS — Z791 Long term (current) use of non-steroidal anti-inflammatories (NSAID): Secondary | ICD-10-CM | POA: Insufficient documentation

## 2014-04-16 DIAGNOSIS — S79919A Unspecified injury of unspecified hip, initial encounter: Secondary | ICD-10-CM | POA: Insufficient documentation

## 2014-04-16 DIAGNOSIS — M25569 Pain in unspecified knee: Secondary | ICD-10-CM

## 2014-04-16 DIAGNOSIS — I10 Essential (primary) hypertension: Secondary | ICD-10-CM | POA: Insufficient documentation

## 2014-04-16 DIAGNOSIS — Y929 Unspecified place or not applicable: Secondary | ICD-10-CM | POA: Insufficient documentation

## 2014-04-16 DIAGNOSIS — F17200 Nicotine dependence, unspecified, uncomplicated: Secondary | ICD-10-CM | POA: Insufficient documentation

## 2014-04-16 DIAGNOSIS — M25559 Pain in unspecified hip: Secondary | ICD-10-CM

## 2014-04-16 DIAGNOSIS — G43909 Migraine, unspecified, not intractable, without status migrainosus: Secondary | ICD-10-CM | POA: Insufficient documentation

## 2014-04-16 DIAGNOSIS — Y9389 Activity, other specified: Secondary | ICD-10-CM | POA: Insufficient documentation

## 2014-04-16 DIAGNOSIS — T148XXA Other injury of unspecified body region, initial encounter: Secondary | ICD-10-CM

## 2014-04-16 DIAGNOSIS — M549 Dorsalgia, unspecified: Secondary | ICD-10-CM

## 2014-04-16 DIAGNOSIS — Z9889 Other specified postprocedural states: Secondary | ICD-10-CM | POA: Insufficient documentation

## 2014-04-16 DIAGNOSIS — G8929 Other chronic pain: Secondary | ICD-10-CM | POA: Insufficient documentation

## 2014-04-16 DIAGNOSIS — M79676 Pain in unspecified toe(s): Secondary | ICD-10-CM

## 2014-04-16 MED ORDER — ONDANSETRON 4 MG PO TBDP
8.0000 mg | ORAL_TABLET | Freq: Once | ORAL | Status: AC
  Administered 2014-04-16: 8 mg via ORAL
  Filled 2014-04-16: qty 2

## 2014-04-16 MED ORDER — OXYCODONE-ACETAMINOPHEN 5-325 MG PO TABS
1.0000 | ORAL_TABLET | Freq: Once | ORAL | Status: AC
  Administered 2014-04-16: 1 via ORAL
  Filled 2014-04-16: qty 1

## 2014-04-16 MED ORDER — OXYCODONE-ACETAMINOPHEN 5-325 MG PO TABS
2.0000 | ORAL_TABLET | Freq: Once | ORAL | Status: AC
  Administered 2014-04-16: 2 via ORAL
  Filled 2014-04-16: qty 2

## 2014-04-16 NOTE — ED Notes (Signed)
Pt reports falling down approx 10 steps, no loc. Having mid back pain, right hip pain, right leg pain and right toe pain.

## 2014-04-16 NOTE — Discharge Instructions (Signed)
Fall Prevention and Home Safety Falls cause injuries and can affect all age groups. It is possible to prevent falls.  HOW TO PREVENT FALLS  Wear shoes with rubber soles that do not have an opening for your toes.  Keep the inside and outside of your house well lit.  Use night lights throughout your home.  Remove clutter from floors.  Clean up floor spills.  Remove throw rugs or fasten them to the floor with carpet tape.  Do not place electrical cords across pathways.  Put grab bars by your tub, shower, and toilet. Do not use towel bars as grab bars.  Put handrails on both sides of the stairway. Fix loose handrails.  Do not climb on stools or stepladders, if possible.  Do not wax your floors.  Repair uneven or unsafe sidewalks, walkways, or stairs.  Keep items you use a lot within reach.  Be aware of pets.  Keep emergency numbers next to the telephone.  Put smoke detectors in your home and near bedrooms. Ask your doctor what other things you can do to prevent falls. Document Released: 09/02/2009 Document Revised: 05/07/2012 Document Reviewed: 02/06/2012 [facility] Patient Information 2014 National, [place].  Musculoskeletal Pain Musculoskeletal pain is muscle and boney aches and pains. These pains can occur in any part of the body. Your caregiver may treat you without knowing the cause of the pain. They may treat you if blood or urine tests, X-rays, and other tests were normal.  CAUSES There is often not a definite cause or reason for these pains. These pains may be caused by a type of germ (virus). The discomfort may also come from overuse. Overuse includes working out too hard when your body is not fit. Boney aches also come from weather changes. Bone is sensitive to atmospheric pressure changes. HOME CARE INSTRUCTIONS   Ask when your test results will be ready. Make sure you get your test results.  Only take over-the-counter or prescription medicines for pain, discomfort,  or fever as directed by your caregiver. If you were given medications for your condition, do not drive, operate machinery or power tools, or sign legal documents for 24 hours. Do not drink alcohol. Do not take sleeping pills or other medications that may interfere with treatment.  Continue all activities unless the activities cause more pain. When the pain lessens, slowly resume normal activities. Gradually increase the intensity and duration of the activities or exercise.  During periods of severe pain, bed rest may be helpful. Lay or sit in any position that is comfortable.  Putting ice on the injured area.  Put ice in a bag.  Place a towel between your skin and the bag.  Leave the ice on for 15 to 20 minutes, 3 to 4 times a day.  Follow up with your caregiver for continued problems and no reason can be found for the pain. If the pain becomes worse or does not go away, it may be necessary to repeat tests or do additional testing. Your caregiver may need to look further for a possible cause. SEEK IMMEDIATE MEDICAL CARE IF:  You have pain that is getting worse and is not relieved by medications.  You develop chest pain that is associated with shortness or breath, sweating, feeling sick to your stomach (nauseous), or throw up (vomit).  Your pain becomes localized to the abdomen.  You develop any new symptoms that seem different or that concern you. MAKE SURE YOU:   Understand these instructions.  Will watch  your condition.  Will get help right away if you are not doing well or get worse. Document Released: 11/06/2005 Document Revised: 01/29/2012 Document Reviewed: 07/11/2013 [facility] Patient Information 2014 [place].   Emergency Department Resource Guide 1) Find a Doctor and Pay Out of Pocket Although you won't have to find out who is covered by your insurance plan, it is a good idea to ask around and get recommendations. You will then need to call the office and see if  the doctor you have chosen will accept you as a new patient and what types of options they offer for patients who are self-pay. Some doctors offer discounts or will set up payment plans for their patients who do not have insurance, but you will need to ask so you aren't surprised when you get to your appointment.  2) Contact Your Local Health Department Not all health departments have doctors that can see patients for sick visits, but many do, so it is worth a call to see if yours does. If you don't know where your local health department is, you can check in your phone book. The CDC also has a tool to help you locate your state's health department, and many state websites also have listings of all of their local health departments.  3) Find a [facility] If your illness is not likely to be very severe or complicated, you may want to try a walk in clinic. These are popping up all over the country in pharmacies, drugstores, and shopping centers. They're usually staffed by nurse practitioners or physician assistants that have been trained to treat common illnesses and complaints. They're usually fairly quick and inexpensive. However, if you have serious medical issues or chronic medical problems, these are probably not your best option.  No Primary Care Doctor: - Call Health Connect at  [phone] - they can help you locate a primary care doctor that  accepts your insurance, provides certain services, etc. - Physician Referral Service- [phone]  Chronic Pain Problems: Organization         Address  Phone   Notes  [facility]  [phone] Patients need to be referred by their primary care doctor.   Medication Assistance: Organization         Address  Phone   Notes  [facility] Medication [facility] [place] [place], [place] [address] [phone] --Must be a resident of [facility] -- Must have NO insurance coverage whatsoever (no  Medicaid/ Medicare, etc.) -- The pt. MUST have a primary care doctor that directs their care regularly and follows them in the community   MedAssist  [phone]   [employer]  [phone]    Agencies that provide inexpensive medical care: Organization         Address  Phone   Notes  [place]  [phone]   [facility]    [phone]   [facility] [place], [place] [address] [phone]   [place] [address] [place] [phone]   Planned Parenthood    [phone]   [facility]    [phone]   [place] [address], [place] Phone:  [phone], Fax:  [phone] Hours of Operation:  9 am - 6 pm, M-F.  Also accepts Medicaid/Medicare and self-pay.  [facility]  [place] [address], [address], [place] Phone: [phone], Fax: [phone]. Hours of Operation:  8:30 am - 5:30 pm, M-F.  Also accepts Medicaid and self-pay.  [facility] [place] [address] [place] Phone: [phone]   [place], [place], [place] [phone], Ext. [phone] Mondays & Thursdays: 7-9 AM.  First 15 patients are seen on a first come, first serve basis.    [place]atherine Providers:  Organization         Address  Phone   Notes  [facility] [address], [place] [phone] Also accepts self-pay patients.  [facility] [id number] [place], [place]  [phone]   [place], [address], [place] [phone]   [facility] [address] [place] [phone]   Lucianne Lei [address], [place]   [phone] Only accepts [place] Access [place] patients after they have their name applied to their card.    Self-Pay (no insurance) in [facility]:  Organization         Address  Phone   Notes  Sickle Cell Patients, [facility] [place] [phone]   [facility] [place] [phone]   [facility] [place]  [place], [place], [place] [phone]   [facility]/Dr. Osei-Bonsu  [address], [place] or [address], [place] [phone] Phone number for both [place] and [place] locations is the same.  Urgent Medical and [facility] [address] [place] [phone]   [facility] [address], [place] or [address] [phone]   [facility] [address] [place] [phone], phone; [phone], fax Sees patients 1st and 3rd Saturday of every month.  Must not qualify for public or private insurance (i.e. Medicaid, Medicare, [place] Health Choice, Veterans' Benefits)  Household income should be no more than 200% of the poverty level The clinic cannot treat you if you are pregnant or think you are pregnant  Sexually transmitted diseases are not treated at the clinic.    Dental Care: Organization         Address  Phone  Notes  [facility] [phone] Accepts children up to age 56 who are enrolled in [place] or [place]; pregnant women with a Medicaid card; and children who have applied for Medicaid or [place] Health Choice, but were declined, whose parents can pay a reduced fee at time of service.  [facility]  [address], [place] [phone] Accepts children up to age 43 who are enrolled in [place] or [place]; pregnant women with a Medicaid card; and children who have applied for Medicaid or [place] Health Choice,  but were declined, whose parents can pay a reduced fee at time of service.  [place] Adult Dental Access PROGRAM  [place] [phone] Patients are seen by appointment only. Walk-ins are not accepted. [place] will see patients 41 years of age and older. Monday - Tuesday (8am-5pm) Most Wednesdays (8:30-5pm) $30 per visit, cash only  [place] Adult  Dental Access PROGRAM  [address], [facility] [phone] Patients are seen by appointment only. Walk-ins are not accepted. [place] will see patients 51 years of age and older. One Wednesday Evening (Monthly: Volunteer Based).  $30 per visit, cash only  [place]  [phone] for adults; Children under age 36, call Graduate Pediatric Dentistry at [phone]. Children aged 82-14, please call [phone] to request a pediatric application.  Dental services are provided in all areas of dental care including fillings, crowns and bridges, complete and partial dentures, implants, gum treatment, root canals, and extractions. Preventive care is also provided. Treatment is provided to both adults and children. Patients are selected via a lottery and there is often a waiting list.   [facility] [address] [place]  [phone] [email or web address]   [facility] [address] [place], [place] [phone], Ext. [phone] Second and Fourth Thursday of each month, opens at 6:30 AM; Clinic ends at 9 AM.  Patients are seen on a first-come first-served basis, and a limited number are seen during each clinic.   [facility]  [address] Hillard Danker [place], [place] [phone]   Eligibility Requirements You must have lived in [place], [place], or [place] counties for at least the last three months.   You cannot be eligible for state or federal sponsored [employer], including [employer], [place], or [employer].   You generally  cannot be eligible for healthcare insurance through your employer.    How to apply: Eligibility screenings are held every Tuesday and Wednesday afternoon from 1:00 pm until 4:00 pm. You do not need an appointment for the interview!  [facility] [address] [place], [place]   [place]  [place] Department  [place]  [phone]    Behavioral Health Resources in the Community: Intensive Outpatient Programs Organization         Address  Phone  Notes  [place]. [address] [place], [place] [phone]   [facility] [address] [place], [place]   ADS: Alcohol & Drug Svcs [address] [place], [place]   [place] [address],  [place], [place] or [phone]   Substance Abuse Resources Organization         Address  Phone  Notes  Alcohol and Drug Services  [phone]   [place]  [phone]   [facility] [place]   Chinita Pester  [phone]   Residential & Outpatient Substance Abuse Program  [phone]   Psychological Services Organization         Address  Phone  Notes  [facility] [place]  [place]  [phone]   [place] [address] [place] [phone] or [phone]    Mobile Crisis Teams Organization         Address  Phone  Notes  [facility], Mobile Crisis Care Unit  [phone]   [facility]otherapeutic [facility]  [address] [place], [place]   [place] [address] [place] [phone]    Self-Help/Support Groups Organization         Address  Phone             Notes  Mental Health  [place] - variety of support groups  336- H3156881 Call for more  information  Narcotics Anonymous (NA), [facility] [address], [employer] [place]  2 meetings at this location   Special educational needs teacher         Address  Phone  Notes  [facility] [place],    [place]  [phone]   [facility]  [address], [place] [address], [place], [place]   [place], [place] [phone] Admissions: 8am-3pm M-F  Incentives Substance [place] [address]    [place], [place] [phone]   [facility] [address] [place], [place], [place]   [facility] [address]  [place], [place]   [facility] - Intensive Outpatient [address], [place], [place]   [facility] [address].,  [place], [place] [phone] or [phone]   [facility] (RTS) [address] [place], [place] Accepts Medicaid  [facility] [place] [address]  [place] [phone] Substance Abuse/Addiction Treatment   [facility] Organization         Address  Phone  Notes  [facility]  [phone]   Domenic Schwab, PhD [address] Arlis [place], [place]   [phone] or [phone]   [place], [place] [phone]   [facility] [address] [place], [place] [phone] Insurance/Medicaid/sponsorship through [facility] and Families [address], [address] [place]                                    [place], [place] [phone] [place] [address], [place] [phone]    Dr. Adele Schilder  [phone]   [facility] Dept. 1) [address], [place] 2) [place] 3)  [place] [address], [place] [phone]  [phone]   [place] [phone] or [phone] (After Hours)

## 2014-04-16 NOTE — ED Notes (Addendum)
Pt stating she does not want to have another xray.  Stating "i have to go home.  i cannot sit through another xray".  Pt tearful.  EDPA made aware.  Stated pt can sign out AMA if she does not want to get Xray.

## 2014-04-16 NOTE — ED Provider Notes (Signed)
CSN: [id number]     Arrival date & time 04/16/14  1628 History  This chart was scribed for non-physician practitioner working with Blanchard Kelch, MD by Mercy Moore, ED Scribe. This patient was seen in room TR11C/TR11C and the patient's care was started at 8:24 PM.   Chief Complaint  Patient presents with  . Fall    HPI HPI Comments: Rita Newton is a 58 y.o. female who presents to the Emergency Department after fall that occurred this morning. Patient reports falling down her back stairs: 10 steps. Patient denies LOC or head injury due to fall. Patient is now complaining of mid back, right hip, right leg, and right great toe pain. She states "I don't care about my toe or my knee, I'm here for my back". Patient is ambulatory since the fall with pain, but ambulation worsens her pain. Patient has taken ibuprofen today, with no relief. Tetanus vaccination UTD.   Past Medical History  Diagnosis Date  . Migraine   . Chronic pain   . Hypertension   . Depression    Past Surgical History  Procedure Laterality Date  . Back surgery    . Abdominal hysterectomy    . Bladder repair     History reviewed. No pertinent family history. History  Substance Use Topics  . Smoking status: Current Every Day Smoker -- 0.50 packs/day    Types: Cigarettes  . Smokeless tobacco: Not on file  . Alcohol Use: No   OB History   Grav Para Term Preterm Abortions TAB SAB Ect Mult Living                 Review of Systems  Musculoskeletal: Positive for arthralgias and back pain.  All other systems reviewed and are negative.     Allergies  Other  Home Medications   Prior to Admission medications   Medication Sig Start Date End Date Taking? Authorizing Provider  Aspirin-Salicylamide-Caffeine (BC HEADACHE POWDER PO) Take 1 packet by mouth 5 (five) times daily as needed. For pain    Historical Provider, MD  cholecalciferol (VITAMIN D-400) 400 UNITS TABS Take 400 Units by mouth daily.     Historical Provider, MD  DULoxetine (CYMBALTA) 60 MG capsule Take 120 mg by mouth daily.    Historical Provider, MD  gabapentin (NEURONTIN) 800 MG tablet Take 800 mg by mouth 3 (three) times daily.     Historical Provider, MD  ibuprofen (ADVIL,MOTRIN) 200 MG tablet Take 600-800 mg by mouth daily as needed for pain. For pain    Historical Provider, MD  lisinopril (PRINIVIL,ZESTRIL) 10 MG tablet Take 10 mg by mouth 2 (two) times daily.    Historical Provider, MD  oxyCODONE-acetaminophen (PERCOCET/ROXICET) 5-325 MG per tablet Take 2 tablets by mouth every 4 (four) hours as needed for pain. 08/13/13   Alvina Chou, PA-C  oxyCODONE-acetaminophen (PERCOCET/ROXICET) 5-325 MG per tablet Take 1-2 pills every 4-6 hours as needed for pain. 08/27/13   Noland Fordyce, PA-C   Triage Vitals: BP 119/87  Pulse 89  Temp(Src) 98.1 F (36.7 C) (Oral)  Resp 18  SpO2 98% Physical Exam  Nursing note and vitals reviewed. Constitutional: She is oriented to person, place, and time. She appears well-developed and well-nourished. No distress.  Tearful, distressed and writhing around in pain during exam.     HENT:  Head: Normocephalic and atraumatic.  Right Ear: External ear normal.  Left Ear: External ear normal.  Nose: Nose normal.  Mouth/Throat: Oropharynx is clear and moist.  Eyes: Conjunctivae are normal.  Neck: Normal range of motion.  Cardiovascular: Normal rate, regular rhythm and normal heart sounds.   Pulses:      Dorsalis pedis pulses are 2+ on the right side, and 2+ on the left side.       Posterior tibial pulses are 2+ on the right side, and 2+ on the left side.  Pulmonary/Chest: Effort normal and breath sounds normal. No stridor. No respiratory distress. She has no wheezes. She has no rales.  Abdominal: Soft. She exhibits no distension.  Musculoskeletal: Normal range of motion.  Tender to palpation of right hip and right knee. No deformities noted.   Swelling and bruising to right great toe.  Compartment soft. Neurovascularly intact.   Neurological: She is alert and oriented to person, place, and time. She has normal strength.  Skin: Skin is warm and dry. She is not diaphoretic. No erythema.  Superficial abrasion to right knee and great toe  Psychiatric: She has a normal mood and affect. Her behavior is normal.    ED Course  Procedures (including critical care time) DIAGNOSTIC STUDIES: Oxygen Saturation is 98% on room air, normal by my interpretation.    COORDINATION OF CARE: 8:28 PM- Discussed treatment plan with patient at bedside and patient agreed to plan.    Labs Review Labs Reviewed - No data to display  Imaging Review Dg Thoracic Spine 2 View  04/16/2014   CLINICAL DATA:  Fall, right side pain  EXAM: THORACIC SPINE - 2 VIEW  COMPARISON:  None.  FINDINGS: Three views of thoracic spine submitted. No acute fracture or subluxation. Mild degenerative changes mid thoracic spine. Minimal dextroscoliosis.  IMPRESSION: No acute fracture or subluxation.  Minimal dextroscoliosis.   Electronically Signed   By: Lahoma Crocker M.D.   On: 04/16/2014 19:06   Dg Lumbar Spine Complete  04/16/2014   CLINICAL DATA:  Fall down flight of stairs, right posterior back pain  EXAM: LUMBAR SPINE - COMPLETE 4+ VIEW  COMPARISON:  Prior lumbar spine radiographs 12/16/2012; prior MRI lumbar spine 04/08/2013  FINDINGS: Query mild compression of the superior endplate of W73 compared to prior imaging. There is less than 20% height loss of the vertebral body anteriorly. Otherwise, the vertebral body heights are maintained. Multilevel degenerative disc disease and lower lumbar facet arthropathy is similar compared to prior without significant interval progression. Normal bony mineralization and alignment. Unremarkable visualized bowel gas pattern.  IMPRESSION: 1. Query mild compression fracture of the superior endplate of X10 compared to 12/16/2012. There is less than 20% height loss anteriorly. 2. Alignment is  maintained.   Electronically Signed   By: Jacqulynn Cadet M.D.   On: 04/16/2014 19:11   Dg Hip Complete Right  04/16/2014   CLINICAL DATA:  Fall.  EXAM: RIGHT HIP - COMPLETE 2+ VIEW  COMPARISON:  02/17/2011.  FINDINGS: Calcifications within the pelvis consistent with phleboliths. Degenerative changes lumbar spine and both hips. No evidence of fracture.  IMPRESSION: No acute abnormality .   Electronically Signed   By: Marcello Moores  Register   On: 04/16/2014 19:12     EKG Interpretation None      MDM   Final diagnoses:  Fall  Back pain  Hip pain  Knee pain  Abrasion  Toe pain   Patient presents to ED for evaluation of back, hip, knee, and toe pain after a fall. There was no head injury, no LOC. Patient demanding pain shot. She was given 15mg  of percocet in ED. Discussed with patient  that she needed to get toe and knee xray as I was concerned for fx given swelling and bruising in these areas. She refuses XR. Offered rx for pain medication if she stayed. Patient still refusing. Will have patient sign out AMA. She was warned of risks which include permanent disability, worsening, pain, loss of toe, loss of life. She understands and is competent to make her own decision.   Patient's TDAP is UTD and she does not need this booster today.   I personally performed the services described in this documentation, which was scribed in my presence. The recorded information has been reviewed and is accurate.    Elwyn Lade, PA-C 04/18/14 [phone]

## 2014-04-21 NOTE — ED Provider Notes (Signed)
Medical screening examination/treatment/procedure(s) were performed by non-physician practitioner and as supervising physician I was immediately available for consultation/collaboration.   EKG Interpretation None        Blanchard Kelch, MD 04/21/14 1346

## 2014-07-10 ENCOUNTER — Emergency Department (HOSPITAL_COMMUNITY)
Admission: EM | Admit: 2014-07-10 | Discharge: 2014-07-10 | Disposition: A | Discharge status: Home / Self Care | Payer: Medicaid Other | Attending: Emergency Medicine | Admitting: Emergency Medicine

## 2014-07-10 ENCOUNTER — Encounter (HOSPITAL_COMMUNITY): Payer: Self-pay | Admitting: Emergency Medicine

## 2014-07-10 DIAGNOSIS — G8929 Other chronic pain: Secondary | ICD-10-CM | POA: Insufficient documentation

## 2014-07-10 DIAGNOSIS — F329 Major depressive disorder, single episode, unspecified: Secondary | ICD-10-CM | POA: Diagnosis not present

## 2014-07-10 DIAGNOSIS — M5430 Sciatica, unspecified side: Secondary | ICD-10-CM | POA: Diagnosis not present

## 2014-07-10 DIAGNOSIS — I10 Essential (primary) hypertension: Secondary | ICD-10-CM | POA: Diagnosis not present

## 2014-07-10 DIAGNOSIS — G43909 Migraine, unspecified, not intractable, without status migrainosus: Secondary | ICD-10-CM | POA: Diagnosis not present

## 2014-07-10 DIAGNOSIS — F17200 Nicotine dependence, unspecified, uncomplicated: Secondary | ICD-10-CM | POA: Insufficient documentation

## 2014-07-10 DIAGNOSIS — Z79899 Other long term (current) drug therapy: Secondary | ICD-10-CM | POA: Diagnosis not present

## 2014-07-10 DIAGNOSIS — M25559 Pain in unspecified hip: Secondary | ICD-10-CM | POA: Insufficient documentation

## 2014-07-10 DIAGNOSIS — M5442 Lumbago with sciatica, left side: Secondary | ICD-10-CM

## 2014-07-10 DIAGNOSIS — F3289 Other specified depressive episodes: Secondary | ICD-10-CM | POA: Insufficient documentation

## 2014-07-10 DIAGNOSIS — M549 Dorsalgia, unspecified: Secondary | ICD-10-CM | POA: Diagnosis present

## 2014-07-10 DIAGNOSIS — M25551 Pain in right hip: Secondary | ICD-10-CM

## 2014-07-10 HISTORY — DX: Sciatica, unspecified side: M54.30

## 2014-07-10 MED ORDER — NAPROXEN 500 MG PO TABS: 500.0000 mg | ORAL_TABLET | Freq: Two times a day (BID) | ORAL | Status: DC

## 2014-07-10 MED ORDER — OXYCODONE-ACETAMINOPHEN 5-325 MG PO TABS
1.0000 | ORAL_TABLET | ORAL | Status: DC | PRN
Start: 2014-07-10 — End: 2016-07-10

## 2014-07-10 MED ORDER — OXYCODONE-ACETAMINOPHEN 5-325 MG PO TABS
2.0000 | ORAL_TABLET | Freq: Once | ORAL | Status: AC
  Administered 2014-07-10: 2 via ORAL
  Filled 2014-07-10: qty 2

## 2014-07-10 MED ORDER — CYCLOBENZAPRINE HCL 10 MG PO TABS: 10.0000 mg | ORAL_TABLET | Freq: Two times a day (BID) | ORAL | Status: DC | PRN

## 2014-07-10 NOTE — ED Provider Notes (Signed)
CSN: [id number]     Arrival date & time 07/10/14  1024 History  This chart was scribed for non-physician practitioner Margarita Mail, PA-C working with Francine Graven, DO by Ludger Nutting, ED Scribe. This patient was seen in room TR11C/TR11C and the patient's care was started at 11:33 AM.    Chief Complaint  Patient presents with  . Back Pain    The history is provided by the patient. No language interpreter was used.    HPI Comments: Rita Newton is a 58 y.o. female with past medical history of sciatica who presents to the Emergency Department complaining of chronic lower back pain with radiation to the left leg which has worsened over the past few days. Patient states she fell while pulling on a trash can, striking her lower back pain and then tripped this morning. She also complains of constant, unchanged right hip pain that has been ongoing "for a while". Patient reports having stiffness in the morning and states it occasionally locks. She denies weakness, numbness, bowel/bladder incontinence, fever, urinary symptoms.    Past Medical History  Diagnosis Date  . Migraine   . Chronic pain   . Hypertension   . Depression   . Sciatica    Past Surgical History  Procedure Laterality Date  . Back surgery    . Abdominal hysterectomy    . Bladder repair     No family history on file. History  Substance Use Topics  . Smoking status: Current Every Day Smoker -- 0.50 packs/day    Types: Cigarettes  . Smokeless tobacco: Not on file  . Alcohol Use: No   OB History   Grav Para Term Preterm Abortions TAB SAB Ect Mult Living                 Review of Systems  Constitutional: Negative for fever.  Genitourinary: Negative for dysuria, frequency and difficulty urinating.  Musculoskeletal: Positive for back pain.  Neurological: Negative for weakness and numbness.      Allergies  Other  Home Medications   Prior to Admission medications   Medication Sig Start Date End Date  Taking? Authorizing Provider  Aspirin-Salicylamide-Caffeine (BC HEADACHE POWDER PO) Take 1 packet by mouth 5 (five) times daily as needed. For pain   Yes Historical Provider, MD  DULoxetine (CYMBALTA) 60 MG capsule Take 120 mg by mouth daily.   Yes Historical Provider, MD  gabapentin (NEURONTIN) 800 MG tablet Take 800 mg by mouth 3 (three) times daily.    Yes Historical Provider, MD  lisinopril (PRINIVIL,ZESTRIL) 10 MG tablet Take 10 mg by mouth daily.    Yes Historical Provider, MD   BP 121/87  Pulse 92  Temp(Src) 97.5 F (36.4 C) (Oral)  Resp 24  Ht 5\' 4"  (1.626 m)  Wt 149 lb (67.586 kg)  BMI 25.56 kg/m2  SpO2 96% Physical Exam  Nursing note and vitals reviewed. Constitutional: She is oriented to person, place, and time. She appears well-developed and well-nourished.  Patient appears uncomfortable while lying on right side.  HENT:  Head: Normocephalic and atraumatic.  Cardiovascular: Normal rate.   Pulmonary/Chest: Effort normal.  Abdominal: She exhibits no distension.  Musculoskeletal: Normal range of motion. She exhibits tenderness.  No midline spinal tenderness. Left lumbar paraspinal and  gluteus tenderness. +SLR on left. Antalgic gait. NVI, DTR's normal  Right hip has full strength. ROM limited due to pain.   Neurological: She is alert and oriented to person, place, and time. She has normal strength and  normal reflexes.  Skin: Skin is warm and dry.  Psychiatric: She has a normal mood and affect.    ED Course  Procedures (including critical care time)  DIAGNOSTIC STUDIES: Oxygen Saturation is 96% on RA, adequate by my interpretation.    COORDINATION OF CARE: 11:38 AM Discussed treatment plan with pt at bedside and pt agreed to plan.   Labs Review Labs Reviewed - No data to display  Imaging Review No results found.   EKG Interpretation None      MDM   Final diagnoses:  Acute back pain with sciatica, left  Hip pain, right    Patient with acute on  chronic back pain.  No neurological deficits and normal neuro exam.  Patient can walk but states is painful.  No loss of bowel or bladder control.  No concern for cauda equina.  No fever, night sweats, weight loss, h/o cancer, IVDU.  RICE protocol and pain medicine indicated and discussed with patient.    I personally performed the services described in this documentation, which was scribed in my presence. The recorded information has been reviewed and is accurate.    Margarita Mail, PA-C 07/14/14 1227

## 2014-07-10 NOTE — Discharge Instructions (Signed)
SEEK IMMEDIATE MEDICAL ATTENTION IF: New numbness, tingling, weakness, or problem with the use of your arms or legs.  Severe back pain not relieved with medications.  Change in bowel or bladder control.  Increasing pain in any areas of the body (such as chest or abdominal pain).  Shortness of breath, dizziness or fainting.  Nausea (feeling sick to your stomach), vomiting, fever, or sweats.    Chronic Back Pain  When back pain lasts longer than 3 months, it is called chronic back pain.People with chronic back pain often go through certain periods that are more intense (flare-ups).  CAUSES Chronic back pain can be caused by wear and tear (degeneration) on different structures in your back. These structures include:  The bones of your spine (vertebrae) and the joints surrounding your spinal cord and nerve roots (facets).  The strong, fibrous tissues that connect your vertebrae (ligaments). Degeneration of these structures may result in pressure on your nerves. This can lead to constant pain. HOME CARE INSTRUCTIONS  Avoid bending, heavy lifting, prolonged sitting, and activities which make the problem worse.  Take brief periods of rest throughout the day to reduce your pain. Lying down or standing usually is better than sitting while you are resting.  Take over-the-counter or prescription medicines only as directed by your caregiver. SEEK IMMEDIATE MEDICAL CARE IF:   You have weakness or numbness in one of your legs or feet.  You have trouble controlling your bladder or bowels.  You have nausea, vomiting, abdominal pain, shortness of breath, or fainting. Document Released: 12/14/2004 Document Revised: 01/29/2012 Document Reviewed: 10/21/2011 [facility] Patient Information 2015 [place], [place]. This information is not intended to replace advice given to you by your health care provider. Make sure you discuss any questions you have with your health care provider.  Sciatica Sciatica is  pain, weakness, numbness, or tingling along the path of the sciatic nerve. The nerve starts in the lower back and runs down the back of each leg. The nerve controls the muscles in the lower leg and in the back of the knee, while also providing sensation to the back of the thigh, lower leg, and the sole of your foot. Sciatica is a symptom of another medical condition. For instance, nerve damage or certain conditions, such as a herniated disk or bone spur on the spine, pinch or put pressure on the sciatic nerve. This causes the pain, weakness, or other sensations normally associated with sciatica. Generally, sciatica only affects one side of the body. CAUSES   Herniated or slipped disc.  Degenerative disk disease.  A pain disorder involving the narrow muscle in the buttocks (piriformis syndrome).  Pelvic injury or fracture.  Pregnancy.  Tumor (rare). SYMPTOMS  Symptoms can vary from mild to very severe. The symptoms usually travel from the low back to the buttocks and down the back of the leg. Symptoms can include:  Mild tingling or dull aches in the lower back, leg, or hip.  Numbness in the back of the calf or sole of the foot.  Burning sensations in the lower back, leg, or hip.  Sharp pains in the lower back, leg, or hip.  Leg weakness.  Severe back pain inhibiting movement. These symptoms may get worse with coughing, sneezing, laughing, or prolonged sitting or standing. Also, being overweight may worsen symptoms. DIAGNOSIS  Your caregiver will perform a physical exam to look for common symptoms of sciatica. He or she may ask you to do certain movements or activities that would trigger  sciatic nerve pain. Other tests may be performed to find the cause of the sciatica. These may include:  Blood tests.  X-rays.  Imaging tests, such as an MRI or CT scan. TREATMENT  Treatment is directed at the cause of the sciatic pain. Sometimes, treatment is not necessary and the pain and  discomfort goes away on its own. If treatment is needed, your caregiver may suggest:  Over-the-counter medicines to relieve pain.  Prescription medicines, such as anti-inflammatory medicine, muscle relaxants, or narcotics.  Applying heat or ice to the painful area.  Steroid injections to lessen pain, irritation, and inflammation around the nerve.  Reducing activity during periods of pain.  Exercising and stretching to strengthen your abdomen and improve flexibility of your spine. Your caregiver may suggest losing weight if the extra weight makes the back pain worse.  Physical therapy.  Surgery to eliminate what is pressing or pinching the nerve, such as a bone spur or part of a herniated disk. HOME CARE INSTRUCTIONS   Only take over-the-counter or prescription medicines for pain or discomfort as directed by your caregiver.  Apply ice to the affected area for 20 minutes, 3-4 times a day for the first 48-72 hours. Then try heat in the same way.  Exercise, stretch, or perform your usual activities if these do not aggravate your pain.  Attend physical therapy sessions as directed by your caregiver.  Keep all follow-up appointments as directed by your caregiver.  Do not wear high heels or shoes that do not provide proper support.  Check your mattress to see if it is too soft. A firm mattress may lessen your pain and discomfort. SEEK IMMEDIATE MEDICAL CARE IF:   You lose control of your bowel or bladder (incontinence).  You have increasing weakness in the lower back, pelvis, buttocks, or legs.  You have redness or swelling of your back.  You have a burning sensation when you urinate.  You have pain that gets worse when you lie down or awakens you at night.  Your pain is worse than you have experienced in the past.  Your pain is lasting longer than 4 weeks.  You are suddenly losing weight without reason. MAKE SURE YOU:  Understand these instructions.  Will watch your  condition.  Will get help right away if you are not doing well or get worse. Document Released: 10/31/2001 Document Revised: 05/07/2012 Document Reviewed: 03/17/2012 [facility] Patient Information 2015 [place], [place]. This information is not intended to replace advice given to you by your health care provider. Make sure you discuss any questions you have with your health care provider.

## 2014-07-10 NOTE — ED Notes (Signed)
Pt c/o LBP since tripping over her dog. Hx of chronic LBP with sciatica.

## 2014-07-10 NOTE — ED Notes (Signed)
Pt with chronic sciatica pain is here for exacerbation after she tripped over her dog this am.

## 2014-07-14 NOTE — ED Provider Notes (Signed)
Medical screening examination/treatment/procedure(s) were performed by non-physician practitioner and as supervising physician I was immediately available for consultation/collaboration.   EKG Interpretation None        Francine Graven, DO 07/14/14 1742

## 2015-11-21 DIAGNOSIS — J189 Pneumonia, unspecified organism: Secondary | ICD-10-CM

## 2015-11-21 HISTORY — DX: Pneumonia, unspecified organism: J18.9

## 2016-07-10 ENCOUNTER — Emergency Department (HOSPITAL_COMMUNITY)
Admission: EM | Admit: 2016-07-10 | Discharge: 2016-07-10 | Disposition: A | Discharge status: Home / Self Care | Payer: Medicaid Other | Attending: Emergency Medicine | Admitting: Emergency Medicine

## 2016-07-10 ENCOUNTER — Encounter (HOSPITAL_COMMUNITY): Payer: Self-pay | Admitting: *Deleted

## 2016-07-10 DIAGNOSIS — Z7982 Long term (current) use of aspirin: Secondary | ICD-10-CM | POA: Insufficient documentation

## 2016-07-10 DIAGNOSIS — Z79899 Other long term (current) drug therapy: Secondary | ICD-10-CM | POA: Insufficient documentation

## 2016-07-10 DIAGNOSIS — F17210 Nicotine dependence, cigarettes, uncomplicated: Secondary | ICD-10-CM | POA: Insufficient documentation

## 2016-07-10 DIAGNOSIS — M5432 Sciatica, left side: Secondary | ICD-10-CM | POA: Diagnosis not present

## 2016-07-10 DIAGNOSIS — I10 Essential (primary) hypertension: Secondary | ICD-10-CM | POA: Diagnosis not present

## 2016-07-10 DIAGNOSIS — M545 Low back pain: Secondary | ICD-10-CM | POA: Diagnosis present

## 2016-07-10 LAB — URINALYSIS, ROUTINE W REFLEX MICROSCOPIC
BILIRUBIN URINE: NEGATIVE
GLUCOSE, UA: NEGATIVE mg/dL
HGB URINE DIPSTICK: NEGATIVE
KETONES UR: NEGATIVE mg/dL
Leukocytes, UA: NEGATIVE
Nitrite: NEGATIVE
PH: 6 (ref 5.0–8.0)
PROTEIN: NEGATIVE mg/dL
Specific Gravity, Urine: 1.014 (ref 1.005–1.030)

## 2016-07-10 MED ORDER — DEXAMETHASONE SODIUM PHOSPHATE 10 MG/ML IJ SOLN
10.0000 mg | Freq: Once | INTRAMUSCULAR | Status: AC
  Administered 2016-07-10: 10 mg via INTRAMUSCULAR
  Filled 2016-07-10: qty 1

## 2016-07-10 MED ORDER — KETOROLAC TROMETHAMINE 60 MG/2ML IM SOLN
60.0000 mg | Freq: Once | INTRAMUSCULAR | Status: AC
  Administered 2016-07-10: 60 mg via INTRAMUSCULAR
  Filled 2016-07-10: qty 2

## 2016-07-10 MED ORDER — OXYCODONE-ACETAMINOPHEN 5-325 MG PO TABS
1.0000 | ORAL_TABLET | Freq: Once | ORAL | Status: AC
  Administered 2016-07-10: 1 via ORAL
  Filled 2016-07-10: qty 1

## 2016-07-10 MED ORDER — OXYCODONE-ACETAMINOPHEN 5-325 MG PO TABS: 1.0000 | ORAL_TABLET | ORAL | 0 refills | Status: DC | PRN

## 2016-07-10 MED ORDER — CYCLOBENZAPRINE HCL 10 MG PO TABS: 10.0000 mg | ORAL_TABLET | Freq: Two times a day (BID) | ORAL | 0 refills | Status: DC | PRN

## 2016-07-10 MED ORDER — IBUPROFEN 800 MG PO TABS: 800.0000 mg | ORAL_TABLET | Freq: Three times a day (TID) | ORAL | 0 refills | Status: DC

## 2016-07-10 NOTE — ED Notes (Signed)
Declined W/C at D/C and was escorted to lobby by RN. 

## 2016-07-10 NOTE — ED Provider Notes (Signed)
[facility] DEPT Provider Note   CSN: [id number] Arrival date & time: 07/10/16  1107  By signing my name below, I, Dora Sims, attest that this documentation has been prepared under the direction and in the presence of Delsa Grana, PA-C. Electronically Signed: Dora Sims, Scribe. 07/10/2016. 2:15 PM.  History   Chief Complaint Chief Complaint  Patient presents with  . Hip Pain  . Back Pain    The history is provided by the patient. No language interpreter was used.     HPI Comments: Rita Newton is a 60 y.o. female with PMHx of sciatica and chronic pain who presents to the Emergency Department complaining of gradual onset, intermittent, worsening, aching, midline lower back pain for the last several months. She states she is having a flare up of lower back pain; her last similar episode was "a while ago." Pt notes pain radiation into her left buttock and left thigh. She states the pain intermittently radiates down her LLE. Pt notes numbness/tingling of her LLE which has been ongoing since surgery for sciatica. She also notes some associated intermittent nausea. Pt reports weakness of both of her legs at baseline. Pt states her current symptoms feel like a pinched nerve which she has had before. She endorses pain exacerbation with palpation to her lower back, ambulation, and weight bearing. Pt states she has a very hard time standing up when her pain is most severe. She has tried various OTC medications with no pain relief. She states she has not had images of her back taken in a couple of years. She denies h/o long-term drug use or steroid use; she states she is allergic to various steroids. Pt denies bowel incontinence, difficulty urinating, dysuria, hematuria, abdominal pain, vomiting, fever, neuro deficits, or any other associated symptoms.  Past Medical History:  Diagnosis Date  . Chronic pain   . Depression   . Hypertension   . Migraine   . Sciatica     Patient Active  Problem List   Diagnosis Date Noted  . Migraine   . Chronic pain   . OBESITY 08/30/2010  . DEPRESSION 08/30/2010  . MENOPAUSE, SURGICAL 06/20/2010  . FIBROIDS, UTERUS 09/20/2009  . ABDOMINAL PAIN, RIGHT LOWER QUADRANT 09/14/2009  . IRRITABLE BOWEL SYNDROME 01/11/2009  . LAMINECTOMY, LUMBAR, HX OF 06/18/2008  . DENTAL CARIES 09/16/2007  . 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 SYNOVIAL CYST 08/19/2007  . BACK PAIN, LUMBAR, WITH RADICULOPATHY 08/05/2007  . TOBACCO USER 08/02/2007  . HYPERTENSION 08/02/2007  . PARESTHESIA 08/02/2007    Past Surgical History:  Procedure Laterality Date  . ABDOMINAL HYSTERECTOMY    . BACK SURGERY    . BLADDER REPAIR      OB History    No data available       Home Medications    Prior to Admission medications   Medication Sig Start Date End Date Taking? Authorizing Provider  Aspirin-Salicylamide-Caffeine (BC HEADACHE POWDER PO) Take 1 packet by mouth 5 (five) times daily as needed. For pain    Historical Provider, MD  cyclobenzaprine (FLEXERIL) 10 MG tablet Take 1 tablet (10 mg total) by mouth 2 (two) times daily as needed for muscle spasms. 07/10/14   Margarita Mail, PA-C  DULoxetine (CYMBALTA) 60 MG capsule Take 120 mg by mouth daily.    Historical Provider, MD  gabapentin (NEURONTIN) 800 MG tablet Take 800 mg by mouth 3 (three) times daily.     Historical Provider, MD  lisinopril (PRINIVIL,ZESTRIL) 10 MG  tablet Take 10 mg by mouth daily.     Historical Provider, MD  naproxen (NAPROSYN) 500 MG tablet Take 1 tablet (500 mg total) by mouth 2 (two) times daily. 07/10/14   Margarita Mail, PA-C  oxyCODONE-acetaminophen (PERCOCET) 5-325 MG per tablet Take 1-2 tablets by mouth every 4 (four) hours as needed. 07/10/14   Margarita Mail, PA-C    Family History History reviewed. No pertinent family history.  Social History Social History  Substance Use Topics  . Smoking status: Current Every Day Smoker    Packs/day:  0.50    Types: Cigarettes  . Smokeless tobacco: Not on file  . Alcohol use No     Allergies   Other   Review of Systems Review of Systems  All other systems reviewed and are negative.   A complete 10 system review of systems was obtained and all systems are negative except as noted in the HPI and PMH.   Physical Exam Updated Vital Signs BP 111/88 (BP Location: Right Arm)   Pulse 74   Temp 98.5 F (36.9 C) (Oral)   Resp 20   Wt 150 lb (68 kg)   SpO2 96%   BMI 25.75 kg/m   Physical Exam  Constitutional: She is oriented to person, place, and time. She appears well-developed and well-nourished. No distress.  HENT:  Head: Normocephalic and atraumatic.  Right Ear: External ear normal.  Left Ear: External ear normal.  Nose: Nose normal.  Mouth/Throat: Oropharynx is clear and moist. No oropharyngeal exudate.  Eyes: Conjunctivae and EOM are normal. Pupils are equal, round, and reactive to light. Right eye exhibits no discharge. Left eye exhibits no discharge. No scleral icterus.  Neck: Normal range of motion. Neck supple. No JVD present. No tracheal deviation present.  Cardiovascular: Normal rate, regular rhythm, normal heart sounds and intact distal pulses.   Pulmonary/Chest: Effort normal and breath sounds normal. No stridor. No respiratory distress.  Abdominal: Soft. Bowel sounds are normal. She exhibits no distension and no mass. There is no tenderness. There is no guarding.  No CVA tenderness bilaterally  Musculoskeletal: Normal range of motion. She exhibits tenderness. She exhibits no edema or deformity.  ttp to bilateral lumbar paraspinal muscles, left SI joint ttp  Lymphadenopathy:    She has no cervical adenopathy.  Neurological: She is alert and oriented to person, place, and time. She exhibits normal muscle tone. Coordination normal.  Antalgic gait  Skin: Skin is warm and dry. No rash noted. She is not diaphoretic. No erythema. No pallor.  Psychiatric: She has a  normal mood and affect. Her behavior is normal. Judgment and thought content normal.  Nursing note and vitals reviewed.   ED Treatments / Results  Labs (all labs ordered are listed, but only abnormal results are displayed) Labs Reviewed  URINALYSIS, ROUTINE W REFLEX MICROSCOPIC (NOT AT [facility])    EKG  EKG Interpretation None       Radiology No results found.  Procedures Procedures (including critical care time)  DIAGNOSTIC STUDIES: Oxygen Saturation is 96% on RA, adequate by my interpretation.    COORDINATION OF CARE: 2:15 PM Discussed treatment plan with pt at bedside and pt agreed to plan.  Medications Ordered in ED Medications  ketorolac (TORADOL) injection 60 mg (not administered)  dexamethasone (DECADRON) injection 10 mg (not administered)  oxyCODONE-acetaminophen (PERCOCET/ROXICET) 5-325 MG per tablet 1 tablet (not administered)     Initial Impression / Assessment and Plan / ED Course  I have reviewed the triage vital signs  and the nursing notes.  Pertinent labs & imaging results that were available during my care of the patient were reviewed by me and considered in my medical decision making (see chart for details).  Clinical Course  Normal neurological exam, no evidence of urinary incontinence or retention, pain is consistently reproducible. There is no evidence of AAA or concern for dissection at this time.   Patient can walk but states is painful.  No loss of bowel or bladder control.  UA negative, No concern for cauda equina.  No fever, night sweats, weight loss, h/o cancer, IVDU.  Pain treated here in the department with adequate improvement. RICE protocol and pain medicine indicated and discussed with patient. I have also discussed reasons to return immediately to the ER.  Patient expresses understanding and agrees with plan.    I personally performed the services described in this documentation, which was scribed in my presence. The recorded information has  been reviewed and is accurate.     Final Clinical Impressions(s) / ED Diagnoses   Final diagnoses:  Sciatica of left side    New Prescriptions New Prescriptions   No medications on file     Delsa Grana, PA-C 07/12/16 [place], MD 07/13/16 [phone]

## 2016-07-10 NOTE — ED Triage Notes (Signed)
Pt reports left side lower back pain for months, radiates into left hip and leg.

## 2016-07-18 ENCOUNTER — Encounter (HOSPITAL_COMMUNITY): Payer: Self-pay

## 2016-07-18 ENCOUNTER — Emergency Department (HOSPITAL_COMMUNITY)
Admission: EM | Admit: 2016-07-18 | Discharge: 2016-07-18 | Disposition: A | Discharge status: Home / Self Care | Payer: Medicaid Other | Attending: Emergency Medicine | Admitting: Emergency Medicine

## 2016-07-18 ENCOUNTER — Emergency Department (HOSPITAL_COMMUNITY): Payer: Medicaid Other

## 2016-07-18 DIAGNOSIS — M25552 Pain in left hip: Secondary | ICD-10-CM | POA: Diagnosis present

## 2016-07-18 DIAGNOSIS — M5126 Other intervertebral disc displacement, lumbar region: Secondary | ICD-10-CM | POA: Diagnosis not present

## 2016-07-18 DIAGNOSIS — Z7982 Long term (current) use of aspirin: Secondary | ICD-10-CM | POA: Insufficient documentation

## 2016-07-18 DIAGNOSIS — I10 Essential (primary) hypertension: Secondary | ICD-10-CM | POA: Diagnosis not present

## 2016-07-18 DIAGNOSIS — M549 Dorsalgia, unspecified: Secondary | ICD-10-CM

## 2016-07-18 DIAGNOSIS — F17210 Nicotine dependence, cigarettes, uncomplicated: Secondary | ICD-10-CM | POA: Diagnosis not present

## 2016-07-18 DIAGNOSIS — Z79899 Other long term (current) drug therapy: Secondary | ICD-10-CM | POA: Diagnosis not present

## 2016-07-18 LAB — URINALYSIS, ROUTINE W REFLEX MICROSCOPIC
Bilirubin Urine: NEGATIVE
GLUCOSE, UA: NEGATIVE mg/dL
Hgb urine dipstick: NEGATIVE
Ketones, ur: NEGATIVE mg/dL
LEUKOCYTES UA: NEGATIVE
Nitrite: NEGATIVE
PROTEIN: NEGATIVE mg/dL
Specific Gravity, Urine: 1.015 (ref 1.005–1.030)
pH: 6.5 (ref 5.0–8.0)

## 2016-07-18 MED ORDER — DEXAMETHASONE SODIUM PHOSPHATE 10 MG/ML IJ SOLN
10.0000 mg | Freq: Once | INTRAMUSCULAR | Status: AC
  Administered 2016-07-18: 10 mg via INTRAVENOUS
  Filled 2016-07-18: qty 1

## 2016-07-18 MED ORDER — OXYCODONE-ACETAMINOPHEN 5-325 MG PO TABS: 1.0000 | ORAL_TABLET | ORAL | 0 refills | Status: DC | PRN

## 2016-07-18 MED ORDER — HYDROMORPHONE HCL 1 MG/ML IJ SOLN
1.0000 mg | Freq: Once | INTRAMUSCULAR | Status: AC
  Administered 2016-07-18: 1 mg via INTRAVENOUS
  Filled 2016-07-18: qty 1

## 2016-07-18 MED ORDER — METHYLPREDNISOLONE 4 MG PO TBPK: ORAL_TABLET | ORAL | 0 refills | Status: DC

## 2016-07-18 NOTE — ED Notes (Signed)
Pt refused gown.  

## 2016-07-18 NOTE — ED Provider Notes (Signed)
[facility] DEPT Provider Note   CSN: [id number] Arrival date & time: 07/18/16  [id number]     History   Chief Complaint Chief Complaint  Patient presents with  . Hip Pain    HPI SHIMEKA ACORS is a 60 y.o. female.  The history is provided by the patient and medical records.    60 year old female with history of depression, hypertension, migraine headaches, chronic back pain, presenting to the ED for worsening back pain. Patient states for the past few months she has been having issues with left-sided sciatica. She states she has a history of same in the past.  States she had surgery years ago with Dr. Hal Neer.  States she had laminectomy as well as cyst removal back in 2009.  States she missed a few appointments with him in follow-up and was dismissed from the practice. States she was seen here about 8 days ago and received some pain medication which did not really seem to help her symptoms. She is planning to follow-up with a neurosurgeon in [place] which she has been referred to by her primary care doctor. States over the past few days she has noticed some episodes of urinary incontinence, mostly occur when she is lying in bed at night. She states she does not get the sensation that she needs to urinate and has had some dribbling. Denies hx of same in the past.  She denies any feelings of dysuria or hematuria. Does report history of hysterectomy with bladder sling as well.  She denies any fever or chills. No history of cancer or IV drug use.  Past Medical History:  Diagnosis Date  . Chronic pain   . Depression   . Hypertension   . Migraine   . Sciatica     Patient Active Problem List   Diagnosis Date Noted  . Migraine   . Chronic pain   . OBESITY 08/30/2010  . DEPRESSION 08/30/2010  . MENOPAUSE, SURGICAL 06/20/2010  . FIBROIDS, UTERUS 09/20/2009  . ABDOMINAL PAIN, RIGHT LOWER QUADRANT 09/14/2009  . IRRITABLE BOWEL SYNDROME 01/11/2009  . LAMINECTOMY, LUMBAR, HX OF  06/18/2008  . DENTAL CARIES 09/16/2007  . 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 SYNOVIAL CYST 08/19/2007  . BACK PAIN, LUMBAR, WITH RADICULOPATHY 08/05/2007  . TOBACCO USER 08/02/2007  . HYPERTENSION 08/02/2007  . PARESTHESIA 08/02/2007    Past Surgical History:  Procedure Laterality Date  . ABDOMINAL HYSTERECTOMY    . BACK SURGERY    . BLADDER REPAIR      OB History    No data available       Home Medications    Prior to Admission medications   Medication Sig Start Date End Date Taking? Authorizing Provider  Aspirin-Salicylamide-Caffeine (BC HEADACHE POWDER PO) Take 1 packet by mouth 5 (five) times daily as needed. For pain    Historical Provider, MD  cyclobenzaprine (FLEXERIL) 10 MG tablet Take 1 tablet (10 mg total) by mouth 2 (two) times daily as needed for muscle spasms. 07/10/14   Margarita Mail, PA-C  cyclobenzaprine (FLEXERIL) 10 MG tablet Take 1 tablet (10 mg total) by mouth 2 (two) times daily as needed for muscle spasms. 07/10/16   Delsa Grana, PA-C  DULoxetine (CYMBALTA) 60 MG capsule Take 120 mg by mouth daily.    Historical Provider, MD  gabapentin (NEURONTIN) 800 MG tablet Take 800 mg by mouth 3 (three) times daily.     Historical Provider, MD  ibuprofen (ADVIL,MOTRIN) 800 MG tablet Take 1 tablet (  800 mg total) by mouth 3 (three) times daily. 07/10/16   Delsa Grana, PA-C  lisinopril (PRINIVIL,ZESTRIL) 10 MG tablet Take 10 mg by mouth daily.     Historical Provider, MD  naproxen (NAPROSYN) 500 MG tablet Take 1 tablet (500 mg total) by mouth 2 (two) times daily. 07/10/14   Margarita Mail, PA-C  oxyCODONE-acetaminophen (PERCOCET) 5-325 MG tablet Take 1 tablet by mouth every 4 (four) hours as needed. 07/10/16   Delsa Grana, PA-C    Family History No family history on file.  Social History Social History  Substance Use Topics  . Smoking status: Current Every Day Smoker    Packs/day: 0.50    Types: Cigarettes  . Smokeless tobacco:  Never Used  . Alcohol use No     Allergies   Other   Review of Systems Review of Systems  Musculoskeletal: Positive for back pain.  All other systems reviewed and are negative.    Physical Exam Updated Vital Signs BP 131/71 (BP Location: Left Arm)   Pulse 62   Temp 97.8 F (36.6 C) (Oral)   Resp 16   SpO2 100%   Physical Exam  Constitutional: She is oriented to person, place, and time. She appears well-developed and well-nourished.  HENT:  Head: Normocephalic and atraumatic.  Mouth/Throat: Oropharynx is clear and moist.  Eyes: Conjunctivae and EOM are normal. Pupils are equal, round, and reactive to light.  Neck: Normal range of motion.  Cardiovascular: Normal rate, regular rhythm and normal heart sounds.   Pulmonary/Chest: Effort normal and breath sounds normal.  Abdominal: Soft. Bowel sounds are normal.  Musculoskeletal: Normal range of motion.       Back:  Significant tenderness of the left SI joint with reported radiation into the left hip and groin; normal strength and sensation of both legs; + SLR on left, - on right; gait is slow due to pain but no ataxia noted  Neurological: She is alert and oriented to person, place, and time.  Skin: Skin is warm and dry.  Psychiatric: She has a normal mood and affect.  Nursing note and vitals reviewed.    ED Treatments / Results  Labs (all labs ordered are listed, but only abnormal results are displayed) Labs Reviewed  URINALYSIS, ROUTINE W REFLEX MICROSCOPIC (NOT AT [facility])    EKG  EKG Interpretation None       Radiology Mr Lumbar Spine Wo Contrast  Result Date: 07/18/2016 CLINICAL DATA:  Low back pain for months. Radiates down to the left hip and leg. EXAM: MRI LUMBAR SPINE WITHOUT CONTRAST TECHNIQUE: Multiplanar, multisequence MR imaging of the lumbar spine was performed. No intravenous contrast was administered. COMPARISON:  None. FINDINGS: Segmentation:  Standard. Alignment: 4 mm of retrolisthesis of L2 on  L3. 2 mm of retrolisthesis of L3 on L4. Vertebrae: Chronic T12 vertebral body compression fracture with approximately 15% height loss. No acute fracture, evidence of discitis, or bone lesion. Conus medullaris: Extends to the T12-L1 level and appears normal. Paraspinal and other soft tissues: No focal paraspinal abnormality. Disc levels: Disc spaces: Disc desiccation throughout the lumbar spine with mild disc height loss at L5-S1 and L2-3. T12-L1: No significant disc bulge. No evidence of neural foraminal stenosis. No central canal stenosis. L1-L2: Mild broad-based disc bulge. No evidence of neural foraminal stenosis. No central canal stenosis. L2-L3: Broad-based disc bulge with bilateral facet arthropathy and mild spinal stenosis. Mild bilateral lateral recess stenosis. No evidence of neural foraminal stenosis. L3-L4: Mild broad-based disc bulge. Moderate bilateral facet  arthropathy. Severe right lateral recess stenosis. Mild spinal stenosis. Mild right foraminal stenosis. No left foraminal stenosis. L4-L5: Broad-based disc bulge. Moderate bilateral facet arthropathy. Bilateral lateral recess stenosis. Moderate left foraminal stenosis. Mild right foraminal stenosis. Mild spinal stenosis. L5-S1: Mild broad-based disc bulge. No evidence of neural foraminal stenosis. No central canal stenosis. IMPRESSION: 1. At L4-5 there is a broad-based disc bulge. Moderate bilateral facet arthropathy. Bilateral lateral recess stenosis. Moderate left foraminal stenosis. Mild right foraminal stenosis. Mild spinal stenosis. 2. At L3-4 there is a mild broad-based disc bulge. Moderate bilateral facet arthropathy. Severe right lateral recess stenosis. Mild spinal stenosis. Mild right foraminal stenosis. 3. At L2-3 there is a broad-based disc bulge with bilateral facet arthropathy and mild spinal stenosis. Mild bilateral lateral recess stenosis. Electronically Signed   By: Kathreen Devoid   On: 07/18/2016 14:35    Procedures Procedures  (including critical care time)  Medications Ordered in ED Medications  HYDROmorphone (DILAUDID) injection 1 mg (1 mg Intravenous Given 07/18/16 1248)  dexamethasone (DECADRON) injection 10 mg (10 mg Intravenous Given 07/18/16 1535)  HYDROmorphone (DILAUDID) injection 1 mg (1 mg Intravenous Given 07/18/16 1534)    Initial Impression / Assessment and Plan / ED Course  I have reviewed the triage vital signs and the nursing notes.  Pertinent labs & imaging results that were available during my care of the patient were reviewed by me and considered in my medical decision making (see chart for details).  Clinical Course   60 year old female here with worsening low back pain and new urinary incontinence over the past few days. These have been isolated episodes. She does not have any neurologic deficits here today and has not had any bowel/bladder incontinence over the past 48 hours.  She is afebrile, non-toxic in appearance but does appear uncomfortable.  Given her new urinary incontinence, MRI of LS was obtained revealing multi-level disc herniation. No findings to suggest cauda equina.  Patient remains at her neurologic baseline here.  She was treated here with IV pain meds and steroids with improvement.  She is ambulatory with steady gait.  Able to control bladder function here.  U/a non-infectious.  Will plan to d/c home with pain meds and steroids.  States she cannot tolerate prednisone so will use medrol dose pack.  She has follow-up with her new neurosurgeon in [place] as arranged through her primary care doctor.  Discussed plan with patient, she acknowledged understanding and agreed with plan of care.  Return precautions given for new or worsening symptoms.  Final Clinical Impressions(s) / ED Diagnoses   Final diagnoses:  Left-sided back pain  Lumbar herniated disc    New Prescriptions New Prescriptions   METHYLPREDNISOLONE (MEDROL DOSEPAK) 4 MG TBPK TABLET    Follow package directions    OXYCODONE-ACETAMINOPHEN (PERCOCET/ROXICET) 5-325 MG TABLET    Take 1 tablet by mouth every 4 (four) hours as needed.     Larene Pickett, PA-C 07/18/16 1655    Sherwood Gambler, MD 07/19/16 [phone]

## 2016-07-18 NOTE — ED Triage Notes (Signed)
Patient here with ongoing left hip pain that she states is related to pinched nerve, seen last Monday for same, NAD, denies new injury

## 2016-07-18 NOTE — Discharge Instructions (Signed)
As we discussed, you were found to have herniated discs in your low back. Please follow-up with your neurosurgeon as scheduled. Take the prescribed medication as directed. Return to the ED for new or worsening symptoms.

## 2016-07-18 NOTE — ED Notes (Signed)
Pt reports she is back to ER due to not being able to get any medication for pain. Unable to schedule an appointment with specialist due to "something that happened years ago."

## 2017-07-01 ENCOUNTER — Emergency Department (HOSPITAL_COMMUNITY): Payer: Medicaid Other

## 2017-07-01 ENCOUNTER — Encounter (HOSPITAL_COMMUNITY): Payer: Self-pay | Admitting: Emergency Medicine

## 2017-07-01 ENCOUNTER — Emergency Department (HOSPITAL_COMMUNITY)
Admission: EM | Admit: 2017-07-01 | Discharge: 2017-07-02 | Disposition: A | Discharge status: Home / Self Care | Payer: Medicaid Other | Attending: Emergency Medicine | Admitting: Emergency Medicine

## 2017-07-01 DIAGNOSIS — Y9389 Activity, other specified: Secondary | ICD-10-CM | POA: Diagnosis not present

## 2017-07-01 DIAGNOSIS — S4992XA Unspecified injury of left shoulder and upper arm, initial encounter: Secondary | ICD-10-CM | POA: Diagnosis present

## 2017-07-01 DIAGNOSIS — Y929 Unspecified place or not applicable: Secondary | ICD-10-CM | POA: Insufficient documentation

## 2017-07-01 DIAGNOSIS — F17210 Nicotine dependence, cigarettes, uncomplicated: Secondary | ICD-10-CM | POA: Diagnosis not present

## 2017-07-01 DIAGNOSIS — Z791 Long term (current) use of non-steroidal anti-inflammatories (NSAID): Secondary | ICD-10-CM | POA: Diagnosis not present

## 2017-07-01 DIAGNOSIS — W108XXA Fall (on) (from) other stairs and steps, initial encounter: Secondary | ICD-10-CM | POA: Insufficient documentation

## 2017-07-01 DIAGNOSIS — Y999 Unspecified external cause status: Secondary | ICD-10-CM | POA: Diagnosis not present

## 2017-07-01 DIAGNOSIS — I10 Essential (primary) hypertension: Secondary | ICD-10-CM | POA: Diagnosis not present

## 2017-07-01 DIAGNOSIS — S42252A Displaced fracture of greater tuberosity of left humerus, initial encounter for closed fracture: Secondary | ICD-10-CM | POA: Diagnosis not present

## 2017-07-01 DIAGNOSIS — Z79899 Other long term (current) drug therapy: Secondary | ICD-10-CM | POA: Diagnosis not present

## 2017-07-01 MED ORDER — OXYCODONE-ACETAMINOPHEN 5-325 MG PO TABS: 1.0000 | ORAL_TABLET | Freq: Four times a day (QID) | ORAL | 0 refills | Status: DC | PRN

## 2017-07-01 MED ORDER — IBUPROFEN 600 MG PO TABS: 600.0000 mg | ORAL_TABLET | Freq: Four times a day (QID) | ORAL | 0 refills | Status: DC | PRN

## 2017-07-01 MED ORDER — IBUPROFEN 400 MG PO TABS: 600.0000 mg | ORAL_TABLET | Freq: Once | ORAL | Status: DC

## 2017-07-01 MED ORDER — OXYCODONE-ACETAMINOPHEN 5-325 MG PO TABS
1.0000 | ORAL_TABLET | Freq: Once | ORAL | Status: DC
Start: 2017-07-01 — End: 2017-07-01

## 2017-07-01 MED ORDER — IBUPROFEN 200 MG PO TABS
600.0000 mg | ORAL_TABLET | Freq: Once | ORAL | Status: AC
  Administered 2017-07-01: 600 mg via ORAL
  Filled 2017-07-01: qty 1

## 2017-07-01 MED ORDER — OXYCODONE-ACETAMINOPHEN 5-325 MG PO TABS
1.0000 | ORAL_TABLET | Freq: Once | ORAL | Status: AC
  Administered 2017-07-01: 1 via ORAL
  Filled 2017-07-01: qty 1

## 2017-07-01 NOTE — ED Notes (Signed)
Pt went outside to smoke several times while waiting for a room

## 2017-07-01 NOTE — ED Triage Notes (Signed)
Patient fell onto steps after trying to avoid a cup on the top steps outside.  She states that she landed on her left arm, having pain from shoulder to wrist on the left side.

## 2017-07-01 NOTE — ED Provider Notes (Signed)
[facility] DEPT Provider Note   CSN: [id number] Arrival date & time: 07/01/17  1934     History   Chief Complaint Chief Complaint  Patient presents with  . Fall  . Arm Injury    HPI Rita Newton is a 61 y.o. female.  HPI  61 y.o. female with a hx of HTN, presents to the Emergency Department today due to mechanical fall PTA. Pt states she tripped trying to avoid a coffee cup on the floor and landed on left shoulder. States that she felt/heard a crack on impact. Notes pain with ROM. No head trauma or LOC. No numbness/tingling. No CP/SOB. Rates pain 10/10. Intermittent. Described as throbbing. No meds PTA. Not on blood thinners. No other symptoms noted   Past Medical History:  Diagnosis Date  . Chronic pain   . Depression   . Hypertension   . Migraine   . Sciatica     Patient Active Problem List   Diagnosis Date Noted  . Migraine   . Chronic pain   . OBESITY 08/30/2010  . DEPRESSION 08/30/2010  . MENOPAUSE, SURGICAL 06/20/2010  . FIBROIDS, UTERUS 09/20/2009  . ABDOMINAL PAIN, RIGHT LOWER QUADRANT 09/14/2009  . IRRITABLE BOWEL SYNDROME 01/11/2009  . LAMINECTOMY, LUMBAR, HX OF 06/18/2008  . DENTAL CARIES 09/16/2007  . 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 SYNOVIAL CYST 08/19/2007  . BACK PAIN, LUMBAR, WITH RADICULOPATHY 08/05/2007  . TOBACCO USER 08/02/2007  . HYPERTENSION 08/02/2007  . PARESTHESIA 08/02/2007    Past Surgical History:  Procedure Laterality Date  . ABDOMINAL HYSTERECTOMY    . BACK SURGERY    . BLADDER REPAIR      OB History    No data available       Home Medications    Prior to Admission medications   Medication Sig Start Date End Date Taking? Authorizing Provider  Aspirin-Salicylamide-Caffeine (BC HEADACHE POWDER PO) Take 1 packet by mouth 5 (five) times daily as needed. For pain    [provider]  cyclobenzaprine (FLEXERIL) 10 MG tablet Take 1 tablet (10 mg total) by mouth 2 (two)  times daily as needed for muscle spasms. 07/10/14   Margarita Mail, PA-C  cyclobenzaprine (FLEXERIL) 10 MG tablet Take 1 tablet (10 mg total) by mouth 2 (two) times daily as needed for muscle spasms. 07/10/16   Delsa Grana, PA-C  DULoxetine (CYMBALTA) 60 MG capsule Take 120 mg by mouth daily.    [provider]  gabapentin (NEURONTIN) 800 MG tablet Take 800 mg by mouth 3 (three) times daily.     [provider]  ibuprofen (ADVIL,MOTRIN) 800 MG tablet Take 1 tablet (800 mg total) by mouth 3 (three) times daily. 07/10/16   Delsa Grana, PA-C  lisinopril (PRINIVIL,ZESTRIL) 10 MG tablet Take 10 mg by mouth daily.     [provider]  methylPREDNISolone (MEDROL DOSEPAK) 4 MG TBPK tablet Follow package directions 07/18/16   Larene Pickett, PA-C  naproxen (NAPROSYN) 500 MG tablet Take 1 tablet (500 mg total) by mouth 2 (two) times daily. 07/10/14   Margarita Mail, PA-C  oxyCODONE-acetaminophen (PERCOCET/ROXICET) 5-325 MG tablet Take 1 tablet by mouth every 4 (four) hours as needed. 07/18/16   Larene Pickett, PA-C    Family History No family history on file.  Social History Social History  Substance Use Topics  . Smoking status: Current Every Day Smoker    Packs/day: 0.50    Types: Cigarettes  . Smokeless tobacco: Never Used  .  Alcohol use No     Allergies   Other   Review of Systems Review of Systems ROS reviewed and all are negative for acute change except as noted in the HPI.  Physical Exam Updated Vital Signs BP (!) 134/104 (BP Location: Right Arm)   Pulse 81   Temp 97.7 F (36.5 C) (Oral)   Resp 18   Ht 5\' 4"  (1.626 m)   Wt 72.1 kg (159 lb)   SpO2 100%   BMI 27.29 kg/m   Physical Exam  Constitutional: She is oriented to person, place, and time. Vital signs are normal. She appears well-developed and well-nourished.  HENT:  Head: Normocephalic and atraumatic.  Right Ear: Hearing normal.  Left Ear: Hearing normal.  Eyes: Pupils are equal, round,  and reactive to light. Conjunctivae and EOM are normal.  Neck: Normal range of motion. Neck supple.  Cardiovascular: Normal rate, regular rhythm, normal heart sounds and intact distal pulses.   Pulmonary/Chest: Effort normal and breath sounds normal.  Musculoskeletal: Normal range of motion.  Left upper humerus TTP. Mild swelling. No discoloration. No erythema. Limited ROM due to pain. NVI. Distal pulses appreciated.   Neurological: She is alert and oriented to person, place, and time.  Skin: Skin is warm and dry.  Psychiatric: She has a normal mood and affect. Her speech is normal and behavior is normal. Thought content normal.  Nursing note and vitals reviewed.    ED Treatments / Results  Labs (all labs ordered are listed, but only abnormal results are displayed) Labs Reviewed - No data to display  EKG  EKG Interpretation None       Radiology Dg Forearm Left  Result Date: 07/01/2017 CLINICAL DATA:  Fall EXAM: LEFT FOREARM - 2 VIEW COMPARISON:  None. FINDINGS: There is no evidence of fracture or other focal bone lesions. Soft tissues are unremarkable. IMPRESSION: Negative. Electronically Signed   By: Franchot Gallo M.D.   On: 07/01/2017 21:13   Dg Shoulder Left  Result Date: 07/01/2017 CLINICAL DATA:  Fall EXAM: LEFT SHOULDER - 2+ VIEW COMPARISON:  None. FINDINGS: Fracture the proximal left humerus. Fracture appears comminuted and impacted. Mild displacement. Negative for dislocation Impacted and comminuted fracture of the left humeral neck and greater tuberosity IMPRESSION: Negative. Electronically Signed   By: Franchot Gallo M.D.   On: 07/01/2017 21:12   Dg Humerus Left  Result Date: 07/01/2017 CLINICAL DATA:  Fall EXAM: LEFT HUMERUS - 2+ VIEW COMPARISON:  None. FINDINGS: Fracture the left humeral neck extending into the greater tuberosity. Mild displacement. No dislocation. IMPRESSION: Fracture left humeral neck Electronically Signed   By: Franchot Gallo M.D.   On: 07/01/2017  21:13    Procedures Procedures (including critical care time)  Medications Ordered in ED Medications  ibuprofen (ADVIL,MOTRIN) tablet 600 mg (not administered)  oxyCODONE-acetaminophen (PERCOCET/ROXICET) 5-325 MG per tablet 1 tablet (not administered)     Initial Impression / Assessment and Plan / ED Course  I have reviewed the triage vital signs and the nursing notes.  Pertinent labs & imaging results that were available during my care of the patient were reviewed by me and considered in my medical decision making (see chart for details).  Final Clinical Impressions(s) / ED Diagnoses   {I have reviewed and evaluated the relevant imaging studies.  {I have reviewed the relevant previous healthcare records.  {I obtained HPI from historian.   ED Course:  Assessment: Patient X-Ray shows left humeral neck fracture along great tuberosity. Mild displacement. Pt  advised to follow up with orthopedics. Patient given sling while in ED, conservative therapy recommended and discussed. Given analgesia in ED. NVI on exam. Distal pulses appreciated. I have reviewed the [place] Controlled Substance Reporting System. Given Rx percocet. Patient will be discharged home & is agreeable with above plan. Returns precautions discussed. Pt appears safe for discharge.  Disposition/Plan:  DC Home Additional Verbal discharge instructions given and discussed with patient.  Pt Instructed to f/u with ortho in the next week for evaluation and treatment of symptoms. Return precautions given Pt acknowledges and agrees with plan  Supervising Physician Rancour, Annie Main, MD  Final diagnoses:  Closed displaced fracture of greater tuberosity of left humerus, initial encounter    New Prescriptions New Prescriptions   No medications on file     Shary Decamp, Hershal Coria 07/01/17 2325    Ezequiel Essex, MD 07/02/17 [phone]

## 2017-07-01 NOTE — Discharge Instructions (Signed)
Please read and follow all provided instructions.  Your diagnoses today include:  1. Closed displaced fracture of greater tuberosity of left humerus, initial encounter     Tests performed today include: Vital signs. See below for your results today.   Medications prescribed:  Take as prescribed   Home care instructions:  Follow any educational materials contained in this packet.  Follow-up instructions: Please follow-up with your orthopedics for further evaluation of symptoms and treatment   Return instructions:  Please return to the Emergency Department if you do not get better, if you get worse, or new symptoms OR  - Fever (temperature greater than 101.83F)  - Bleeding that does not stop with holding pressure to the area    -Severe pain (please note that you may be more sore the day after your accident)  - Chest Pain  - Difficulty breathing  - Severe nausea or vomiting  - Inability to tolerate food and liquids  - Passing out  - Skin becoming red around your wounds  - Change in mental status (confusion or lethargy)  - New numbness or weakness    Please return if you have any other emergent concerns.  Additional Information:  Your vital signs today were: BP (!) 134/104 (BP Location: Right Arm)    Pulse 81    Temp 97.7 F (36.5 C) (Oral)    Resp 18    Ht 5\' 4"  (1.626 m)    Wt 72.1 kg (159 lb)    SpO2 100%    BMI 27.29 kg/m  If your blood pressure (BP) was elevated above 135/85 this visit, please have this repeated by your doctor within one month. ---------------

## 2017-07-02 NOTE — ED Notes (Signed)
Arm immobilizer applied.

## 2017-07-12 ENCOUNTER — Ambulatory Visit (INDEPENDENT_AMBULATORY_CARE_PROVIDER_SITE_OTHER): Payer: Medicaid Other

## 2017-07-12 ENCOUNTER — Ambulatory Visit (INDEPENDENT_AMBULATORY_CARE_PROVIDER_SITE_OTHER): Payer: Medicaid Other | Admitting: Family

## 2017-07-12 DIAGNOSIS — M25522 Pain in left elbow: Secondary | ICD-10-CM

## 2017-07-12 DIAGNOSIS — S42215A Unspecified nondisplaced fracture of surgical neck of left humerus, initial encounter for closed fracture: Secondary | ICD-10-CM

## 2017-07-12 DIAGNOSIS — W19XXXA Unspecified fall, initial encounter: Secondary | ICD-10-CM

## 2017-07-12 MED ORDER — OXYCODONE-ACETAMINOPHEN 5-325 MG PO TABS: 1.0000 | ORAL_TABLET | Freq: Three times a day (TID) | ORAL | 0 refills | Status: DC | PRN

## 2017-07-12 NOTE — Progress Notes (Signed)
Office Visit Note   Patient: Rita Newton           Date of Birth: 1956-02-04           MRN: [id number] Visit Date: 07/12/2017              Requested by: No referring provider defined for this encounter. PCP: Patient, No Pcp Per  No chief complaint on file.     HPI: The patient is a 61 year old woman who presents today 2 weeks status post a fall on her left side in which she fractured her left humerus. She also is complaining of intense left elbow pain. She's been unable to extend her elbow. States since seen in the hospital nearly 2 weeks ago has only removed her sling once due to pain.  Assessment & Plan: Visit Diagnoses:  1. Closed nondisplaced fracture of surgical neck of left humerus, unspecified fracture morphology, initial encounter   2. Pain in left elbow     Plan: Follow-up in 2 weeks. Range of motion of the elbow as well as pendulum swinging of the shoulder for the next 2 weeks. Will need repeat radiographs of follow-up.  Follow-Up Instructions: Return in about 2 weeks (around 07/26/2017).   Left Elbow Exam   Tenderness  The patient is experiencing tenderness in the olecranon fossa and radial head.   Range of Motion  Extension: abnormal   Other  Sensation: normal  Comments:  Ecchymosis   Left Shoulder Exam   Tenderness  Left shoulder tenderness location: proximal humerus, anterior shoulder.  Other  Pulse: present   Comments:  Ecchymosis, resists rom      Patient is alert, oriented, no adenopathy, well-dressed, normal affect, normal respiratory effort.   Imaging: No results found. No images are attached to the encounter.  Labs: No results found for: HGBA1C, ESRSEDRATE, CRP, LABURIC, REPTSTATUS, GRAMSTAIN, CULT, LABORGA  Orders:  Orders Placed This Encounter  Procedures  . XR Shoulder Left  . XR Elbow 2 Views Left   No orders of the defined types were placed in this encounter.    Procedures: No procedures performed  Clinical  Data: No additional findings.  ROS:  All other systems negative, except as noted in the HPI. Review of Systems  Constitutional: Negative for chills and fever.  Musculoskeletal: Positive for arthralgias, joint swelling and myalgias.  Skin: Positive for color change.    Objective: Vital Signs: There were no vitals taken for this visit.  Specialty Comments:  No specialty comments available.  PMFS History: Patient Active Problem List   Diagnosis Date Noted  . Migraine   . Chronic pain   . OBESITY 08/30/2010  . DEPRESSION 08/30/2010  . MENOPAUSE, SURGICAL 06/20/2010  . FIBROIDS, UTERUS 09/20/2009  . ABDOMINAL PAIN, RIGHT LOWER QUADRANT 09/14/2009  . IRRITABLE BOWEL SYNDROME 01/11/2009  . LAMINECTOMY, LUMBAR, HX OF 06/18/2008  . DENTAL CARIES 09/16/2007  . 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 SYNOVIAL CYST 08/19/2007  . BACK PAIN, LUMBAR, WITH RADICULOPATHY 08/05/2007  . TOBACCO USER 08/02/2007  . HYPERTENSION 08/02/2007  . PARESTHESIA 08/02/2007   Past Medical History:  Diagnosis Date  . Chronic pain   . Depression   . Hypertension   . Migraine   . Sciatica     No family history on file.  Past Surgical History:  Procedure Laterality Date  . ABDOMINAL HYSTERECTOMY    . BACK SURGERY    . BLADDER REPAIR     Social History  Occupational History  . Not on file.   Social History Main Topics  . Smoking status: Current Every Day Smoker    Packs/day: 0.50    Types: Cigarettes  . Smokeless tobacco: Never Used  . Alcohol use No  . Drug use: No  . Sexual activity: Not on file

## 2017-07-26 ENCOUNTER — Ambulatory Visit (INDEPENDENT_AMBULATORY_CARE_PROVIDER_SITE_OTHER): Payer: Medicaid Other

## 2017-07-26 ENCOUNTER — Ambulatory Visit (INDEPENDENT_AMBULATORY_CARE_PROVIDER_SITE_OTHER): Payer: Medicaid Other | Admitting: Family

## 2017-07-26 DIAGNOSIS — M25522 Pain in left elbow: Secondary | ICD-10-CM | POA: Diagnosis not present

## 2017-07-26 DIAGNOSIS — M25512 Pain in left shoulder: Secondary | ICD-10-CM

## 2017-07-26 DIAGNOSIS — G8929 Other chronic pain: Secondary | ICD-10-CM

## 2017-07-26 DIAGNOSIS — S42215A Unspecified nondisplaced fracture of surgical neck of left humerus, initial encounter for closed fracture: Secondary | ICD-10-CM | POA: Diagnosis not present

## 2017-07-26 MED ORDER — OXYCODONE-ACETAMINOPHEN 5-325 MG PO TABS: 1.0000 | ORAL_TABLET | Freq: Three times a day (TID) | ORAL | 0 refills | Status: DC | PRN

## 2017-07-26 NOTE — Progress Notes (Signed)
Office Visit Note   Patient: Rita Newton           Date of Birth: 02/18/1956           MRN: [id number] Visit Date: 07/26/2017              Requested by: No referring provider defined for this encounter. PCP: Patient, No Pcp Per  Chief Complaint  Patient presents with  . Left Shoulder - Pain, Follow-up  . Left Elbow - Pain, Follow-up      HPI: Patient is a 61 year old woman who presents in follow-up status post left humeral neck fracture nondisplaced and left elbow pain status post fall on her left side she is left arm dominant. Patient states she cannot stop wearing her sling.  Assessment & Plan: Visit Diagnoses:  1. Chronic left shoulder pain   2. Closed nondisplaced fracture of surgical neck of left humerus, unspecified fracture morphology, initial encounter   3. Pain in left elbow     Plan: We will set her up for outpatient therapy at [facility]. She will continue working on range of motion refill prescription for Percocet follow-up in 3 weeks with repeat 2 view radiographs of the left shoulder.  Follow-Up Instructions: Return in about 3 weeks (around 08/16/2017).   Ortho Exam  Patient is alert, oriented, no adenopathy, well-dressed, normal affect, normal respiratory effort. Examination patient does have some bruising over the left forearm she has full supination pronation she lacks about 10 to full extension. Patient does have forced expiratory breathing and does smoke. She has abduction flexion of the left shoulder to 70 she can get her hand to the side of her head. She is showing improvement in her range of motion.  Imaging: Xr Elbow 2 Views Left  Result Date: 07/26/2017 2 view radiographs of the left ankle shows a negative fat pad sign no evidence of a radial neck fracture.  Xr Shoulder Left  Result Date: 07/26/2017 2 view radiographs of the left shoulder shows congruent joint pain humeral neck fracture stable there is no superior migration of the  greater trochanter fracture.  No images are attached to the encounter.  Labs: No results found for: HGBA1C, ESRSEDRATE, CRP, LABURIC, REPTSTATUS, GRAMSTAIN, CULT, LABORGA  Orders:  Orders Placed This Encounter  Procedures  . XR Shoulder Left  . XR Elbow 2 Views Left  . Ambulatory referral to Physical Therapy   Meds ordered this encounter  Medications  . oxyCODONE-acetaminophen (PERCOCET/ROXICET) 5-325 MG tablet    Sig: Take 1 tablet by mouth every 8 (eight) hours as needed for severe pain.    Dispense:  30 tablet    Refill:  0     Procedures: No procedures performed  Clinical Data: No additional findings.  ROS:  All other systems negative, except as noted in the HPI. Review of Systems  Objective: Vital Signs: There were no vitals taken for this visit.  Specialty Comments:  No specialty comments available.  PMFS History: Patient Active Problem List   Diagnosis Date Noted  . Migraine   . Chronic pain   . OBESITY 08/30/2010  . DEPRESSION 08/30/2010  . MENOPAUSE, SURGICAL 06/20/2010  . FIBROIDS, UTERUS 09/20/2009  . ABDOMINAL PAIN, RIGHT LOWER QUADRANT 09/14/2009  . IRRITABLE BOWEL SYNDROME 01/11/2009  . LAMINECTOMY, LUMBAR, HX OF 06/18/2008  . DENTAL CARIES 09/16/2007  . 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 SYNOVIAL CYST 08/19/2007  . BACK PAIN, LUMBAR, WITH RADICULOPATHY 08/05/2007  .  TOBACCO USER 08/02/2007  . HYPERTENSION 08/02/2007  . PARESTHESIA 08/02/2007   Past Medical History:  Diagnosis Date  . Chronic pain   . Depression   . Hypertension   . Migraine   . Sciatica     No family history on file.  Past Surgical History:  Procedure Laterality Date  . ABDOMINAL HYSTERECTOMY    . BACK SURGERY    . BLADDER REPAIR     Social History   Occupational History  . Not on file.   Social History Main Topics  . Smoking status: Current Every Day Smoker    Packs/day: 0.50    Types: Cigarettes  . Smokeless  tobacco: Never Used  . Alcohol use No  . Drug use: No  . Sexual activity: Not on file

## 2017-08-01 ENCOUNTER — Ambulatory Visit: Payer: Self-pay | Admitting: Physical Therapy

## 2017-08-08 ENCOUNTER — Telehealth (INDEPENDENT_AMBULATORY_CARE_PROVIDER_SITE_OTHER): Payer: Self-pay | Admitting: Orthopedic Surgery

## 2017-08-08 NOTE — Telephone Encounter (Signed)
Patient called asking for a refill on oxycodone. CB # [phone]

## 2017-08-09 ENCOUNTER — Other Ambulatory Visit (INDEPENDENT_AMBULATORY_CARE_PROVIDER_SITE_OTHER): Payer: Self-pay | Admitting: Family

## 2017-08-09 MED ORDER — OXYCODONE-ACETAMINOPHEN 5-325 MG PO TABS: 1.0000 | ORAL_TABLET | Freq: Two times a day (BID) | ORAL | 0 refills | Status: DC | PRN

## 2017-08-09 NOTE — Telephone Encounter (Signed)
Notified pt Rx oxycodone-acetaminophen is ready to be pick up front office.

## 2017-08-09 NOTE — Telephone Encounter (Signed)
Called pt and left message to call the office back regarding to a medication.

## 2017-08-16 ENCOUNTER — Ambulatory Visit (INDEPENDENT_AMBULATORY_CARE_PROVIDER_SITE_OTHER): Payer: Medicaid Other | Admitting: Family

## 2017-09-12 ENCOUNTER — Other Ambulatory Visit: Payer: Self-pay | Admitting: Physician Assistant

## 2017-09-12 DIAGNOSIS — M5440 Lumbago with sciatica, unspecified side: Principal | ICD-10-CM

## 2017-09-12 DIAGNOSIS — M545 Low back pain, unspecified: Secondary | ICD-10-CM

## 2017-10-20 ENCOUNTER — Ambulatory Visit
Admission: RE | Admit: 2017-10-20 | Discharge: 2017-10-20 | Disposition: A | Discharge status: Home / Self Care | Payer: Medicaid Other | Source: Ambulatory Visit | Attending: Physician Assistant | Admitting: Physician Assistant

## 2017-10-20 DIAGNOSIS — M545 Low back pain, unspecified: Secondary | ICD-10-CM

## 2017-10-20 DIAGNOSIS — M5440 Lumbago with sciatica, unspecified side: Principal | ICD-10-CM

## 2018-03-24 ENCOUNTER — Emergency Department (HOSPITAL_COMMUNITY)
Admission: EM | Admit: 2018-03-24 | Discharge: 2018-03-24 | Disposition: A | Discharge status: Home / Self Care | Payer: Medicaid Other | Attending: Emergency Medicine | Admitting: Emergency Medicine

## 2018-03-24 ENCOUNTER — Other Ambulatory Visit: Payer: Self-pay

## 2018-03-24 ENCOUNTER — Encounter (HOSPITAL_COMMUNITY): Payer: Self-pay | Admitting: Emergency Medicine

## 2018-03-24 DIAGNOSIS — M545 Low back pain, unspecified: Secondary | ICD-10-CM

## 2018-03-24 DIAGNOSIS — I10 Essential (primary) hypertension: Secondary | ICD-10-CM | POA: Insufficient documentation

## 2018-03-24 DIAGNOSIS — M546 Pain in thoracic spine: Secondary | ICD-10-CM

## 2018-03-24 DIAGNOSIS — M549 Dorsalgia, unspecified: Secondary | ICD-10-CM | POA: Diagnosis present

## 2018-03-24 DIAGNOSIS — G8929 Other chronic pain: Secondary | ICD-10-CM

## 2018-03-24 MED ORDER — TRAMADOL HCL 50 MG PO TABS: 50.0000 mg | ORAL_TABLET | Freq: Four times a day (QID) | ORAL | 0 refills | Status: DC | PRN

## 2018-03-24 MED ORDER — KETOROLAC TROMETHAMINE 30 MG/ML IJ SOLN
60.0000 mg | Freq: Once | INTRAMUSCULAR | Status: AC
  Administered 2018-03-24: 60 mg via INTRAMUSCULAR
  Filled 2018-03-24: qty 2

## 2018-03-24 MED ORDER — PREDNISONE 50 MG PO TABS: 50.0000 mg | ORAL_TABLET | Freq: Every day | ORAL | 0 refills | Status: DC

## 2018-03-24 MED ORDER — CYCLOBENZAPRINE HCL 10 MG PO TABS: 10.0000 mg | ORAL_TABLET | Freq: Every day | ORAL | 0 refills | Status: DC

## 2018-03-24 MED ORDER — OXYCODONE-ACETAMINOPHEN 5-325 MG PO TABS
1.0000 | ORAL_TABLET | Freq: Once | ORAL | Status: AC
  Administered 2018-03-24: 1 via ORAL
  Filled 2018-03-24: qty 1

## 2018-03-24 MED ORDER — DEXAMETHASONE SODIUM PHOSPHATE 10 MG/ML IJ SOLN
10.0000 mg | Freq: Once | INTRAMUSCULAR | Status: AC
  Administered 2018-03-24: 10 mg via INTRAMUSCULAR
  Filled 2018-03-24: qty 1

## 2018-03-24 NOTE — ED Triage Notes (Signed)
Pt reports hx of back pain, has 3 herniated disks and pinched nerve. States pain has been ongoing for months, woke her up at 3am this morning. Takes advil and bc powder with minimal relief.

## 2018-03-24 NOTE — Discharge Instructions (Signed)
Return here as needed.  Follow-up with the neurosurgeon provided.  Use ice and heat on your back.

## 2018-03-24 NOTE — ED Provider Notes (Signed)
[facility] EMERGENCY DEPARTMENT Provider Note   CSN: [id number] Arrival date & time: 03/24/18  1131     History   Chief Complaint Chief Complaint  Patient presents with  . Back Pain    HPI Rita Newton is a 62 y.o. female.  HPI Patient presents to the emergency department with chronic back pain that worsened over the last 2 days.  She states specifically she woke up around 3 AM with worsening back pain.  Patient states that she has been taking over-the-counter medications with no relief of her symptoms.  The patient states that movements and palpation make her pain worse.  Patient states that she has not followed up with any specialist for her back issues.  She states 1 year ago she had an MRI which showed several bulging disks.  The patient denies chest pain, shortness of breath, headache,blurred vision, neck pain, fever, cough, weakness, numbness, dizziness, anorexia, edema, abdominal pain, nausea, vomiting, diarrhea, rash,dysuria, hematemesis, bloody stool, near syncope, or syncope. Past Medical History:  Diagnosis Date  . Chronic pain   . Depression   . Hypertension   . Migraine   . Sciatica     Patient Active Problem List   Diagnosis Date Noted  . Migraine   . Chronic pain   . OBESITY 08/30/2010  . DEPRESSION 08/30/2010  . MENOPAUSE, SURGICAL 06/20/2010  . FIBROIDS, UTERUS 09/20/2009  . ABDOMINAL PAIN, RIGHT LOWER QUADRANT 09/14/2009  . IRRITABLE BOWEL SYNDROME 01/11/2009  . LAMINECTOMY, LUMBAR, HX OF 06/18/2008  . DENTAL CARIES 09/16/2007  . 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 SYNOVIAL CYST 08/19/2007  . BACK PAIN, LUMBAR, WITH RADICULOPATHY 08/05/2007  . TOBACCO USER 08/02/2007  . HYPERTENSION 08/02/2007  . PARESTHESIA 08/02/2007    Past Surgical History:  Procedure Laterality Date  . ABDOMINAL HYSTERECTOMY    . BACK SURGERY    . BLADDER REPAIR       OB History   None      Home Medications      Prior to Admission medications   Medication Sig Start Date End Date Taking? Authorizing Provider  amLODipine (NORVASC) 10 MG tablet Take 10 mg by mouth daily.    [provider]  DULoxetine (CYMBALTA) 60 MG capsule Take 120 mg by mouth daily.    [provider]  gabapentin (NEURONTIN) 600 MG tablet Take 600 mg by mouth 3 (three) times daily.    [provider]  ibuprofen (ADVIL,MOTRIN) 600 MG tablet Take 1 tablet (600 mg total) by mouth every 6 (six) hours as needed. 07/01/17   Shary Decamp, PA-C  lisinopril (PRINIVIL,ZESTRIL) 10 MG tablet Take 10 mg by mouth daily.     [provider]  oxyCODONE-acetaminophen (PERCOCET/ROXICET) 5-325 MG tablet Take 1 tablet by mouth 2 (two) times daily as needed for severe pain. 08/09/17   Suzan Slick, NP    Family History No family history on file.  Social History Social History   Tobacco Use  . Smoking status: Current Every Day Smoker    Packs/day: 0.50    Types: Cigarettes  . Smokeless tobacco: Never Used  Substance Use Topics  . Alcohol use: No  . Drug use: No     Allergies   Other   Review of Systems Review of Systems All other systems negative except as documented in the HPI. All pertinent positives and negatives as reviewed in the HPI. Physical Exam Updated Vital Signs BP (!) 147/113 (BP Location: Left  Arm)   Pulse 88   Temp 97.7 F (36.5 C) (Oral)   Resp (!) 22 Comment: crying  SpO2 100%   Physical Exam  Constitutional: She is oriented to person, place, and time. She appears well-developed and well-nourished. No distress.  HENT:  Head: Normocephalic and atraumatic.  Eyes: Pupils are equal, round, and reactive to light.  Pulmonary/Chest: Effort normal.  Musculoskeletal:       Back:  Neurological: She is alert and oriented to person, place, and time.  Skin: Skin is warm and dry.  Psychiatric: She has a normal mood and affect.  Nursing note and vitals reviewed.    ED Treatments /  Results  Labs (all labs ordered are listed, but only abnormal results are displayed) Labs Reviewed - No data to display  EKG None  Radiology No results found.  Procedures Procedures (including critical care time)  Medications Ordered in ED Medications  ketorolac (TORADOL) 30 MG/ML injection 60 mg (60 mg Intramuscular Given 03/24/18 1410)  oxyCODONE-acetaminophen (PERCOCET/ROXICET) 5-325 MG per tablet 1 tablet (1 tablet Oral Given 03/24/18 1410)  dexamethasone (DECADRON) injection 10 mg (10 mg Intramuscular Given 03/24/18 1410)     Initial Impression / Assessment and Plan / ED Course  I have reviewed the triage vital signs and the nursing notes.  Pertinent labs & imaging results that were available during my care of the patient were reviewed by me and considered in my medical decision making (see chart for details).     Patient has no neurological deficits noted on exam.  This is a chronic issue for the patient which I will refer her to neurosurgery for follow-up.  Patient has no abnormal gait or any deficits or strength issues in her extremities.  Final Clinical Impressions(s) / ED Diagnoses   Final diagnoses:  None    ED Discharge Orders    None       Dalia Heading, PA-C 03/24/18 1417    Hayden Rasmussen, MD 03/25/18 [id number]

## 2018-04-18 ENCOUNTER — Emergency Department (HOSPITAL_COMMUNITY)
Admission: EM | Admit: 2018-04-18 | Discharge: 2018-04-18 | Disposition: A | Discharge status: Home / Self Care | Payer: Medicaid Other | Attending: Emergency Medicine | Admitting: Emergency Medicine

## 2018-04-18 ENCOUNTER — Other Ambulatory Visit: Payer: Self-pay

## 2018-04-18 ENCOUNTER — Encounter (HOSPITAL_COMMUNITY): Payer: Self-pay

## 2018-04-18 DIAGNOSIS — M549 Dorsalgia, unspecified: Secondary | ICD-10-CM | POA: Insufficient documentation

## 2018-04-18 DIAGNOSIS — I10 Essential (primary) hypertension: Secondary | ICD-10-CM | POA: Diagnosis not present

## 2018-04-18 DIAGNOSIS — G8929 Other chronic pain: Secondary | ICD-10-CM | POA: Insufficient documentation

## 2018-04-18 DIAGNOSIS — Z79899 Other long term (current) drug therapy: Secondary | ICD-10-CM | POA: Diagnosis not present

## 2018-04-18 DIAGNOSIS — F17210 Nicotine dependence, cigarettes, uncomplicated: Secondary | ICD-10-CM | POA: Diagnosis not present

## 2018-04-18 DIAGNOSIS — N390 Urinary tract infection, site not specified: Secondary | ICD-10-CM | POA: Insufficient documentation

## 2018-04-18 LAB — URINALYSIS, ROUTINE W REFLEX MICROSCOPIC
BACTERIA UA: NONE SEEN
Glucose, UA: NEGATIVE mg/dL
HGB URINE DIPSTICK: NEGATIVE
KETONES UR: 5 mg/dL — AB
NITRITE: NEGATIVE
PROTEIN: NEGATIVE mg/dL
Specific Gravity, Urine: 1.025 (ref 1.005–1.030)
pH: 5 (ref 5.0–8.0)

## 2018-04-18 MED ORDER — PREDNISONE 50 MG PO TABS: 50.0000 mg | ORAL_TABLET | Freq: Every day | ORAL | 0 refills | Status: DC

## 2018-04-18 MED ORDER — PREDNISONE 20 MG PO TABS
60.0000 mg | ORAL_TABLET | Freq: Once | ORAL | Status: AC
  Administered 2018-04-18: 60 mg via ORAL
  Filled 2018-04-18: qty 3

## 2018-04-18 MED ORDER — KETOROLAC TROMETHAMINE 30 MG/ML IJ SOLN
30.0000 mg | Freq: Once | INTRAMUSCULAR | Status: AC
  Administered 2018-04-18: 30 mg via INTRAMUSCULAR
  Filled 2018-04-18: qty 1

## 2018-04-18 MED ORDER — CEPHALEXIN 500 MG PO CAPS: 500.0000 mg | ORAL_CAPSULE | Freq: Three times a day (TID) | ORAL | 0 refills | Status: DC

## 2018-04-18 MED ORDER — CYCLOBENZAPRINE HCL 10 MG PO TABS: 10.0000 mg | ORAL_TABLET | Freq: Three times a day (TID) | ORAL | 0 refills | Status: DC | PRN

## 2018-04-18 NOTE — ED Notes (Signed)
PA at beside.

## 2018-04-18 NOTE — ED Triage Notes (Signed)
Pt reports back pain in the middle of her back. She was told she has 3 herniated disc. She states the pain has been ongoing X3 days. Pt appears in pain triage. She reports she recently had a steroid shot here and felt much better.

## 2018-04-18 NOTE — ED Provider Notes (Signed)
[facility] EMERGENCY DEPARTMENT Provider Note   CSN: [id number] Arrival date & time: 04/18/18  1056     History   Chief Complaint Chief Complaint  Patient presents with  . Back Pain    HPI Rita Newton is a 62 y.o. female.  HPI  62 year old female with history of chronic pain, sciatica, depression, hypertension presenting for evaluation of back pain.  Patient report intermittent back pain ongoing for more than a year.  Has had an MRI in the past for this and was told that she had several herniated disc.  She was last seen in the ER on 05/L5/2019 for her back pain.  It was felt that her symptom is likely due to her chronic issue and patient was sent home with steroids, pain medication and NSAIDs.  She was referred to neurosurgery Dr. Christella Noa.  Patient states she was evaluated by Dr. Christella Noa who told her that she may have a cyst on her spine.  She is scheduled to have further evaluation for this.  She is supposed to have some further testing however she was told that the test would have to be cleared by insurance before you can be done.  She is here due to worsening pain within the past several days.  Her pain is described as a stabbing sharp pain to her back, nonradiating, persistent nothing seems to make it better or worse.  Pain is similar to prior except more intense.  Movement worsened the pain.  Pain is unrelieved with Advil and BC powder at home.  No associated fever, chills, lightheadedness, dizziness, chest pain, abdominal pain, dysuria, hematuria, bowel bladder incontinence or saddle anesthesia.  No history of IV drug use active cancer.     Past Medical History:  Diagnosis Date  . Chronic pain   . Depression   . Hypertension   . Migraine   . Sciatica     Patient Active Problem List   Diagnosis Date Noted  . Migraine   . Chronic pain   . OBESITY 08/30/2010  . DEPRESSION 08/30/2010  . MENOPAUSE, SURGICAL 06/20/2010  . FIBROIDS, UTERUS 09/20/2009    . ABDOMINAL PAIN, RIGHT LOWER QUADRANT 09/14/2009  . IRRITABLE BOWEL SYNDROME 01/11/2009  . LAMINECTOMY, LUMBAR, HX OF 06/18/2008  . DENTAL CARIES 09/16/2007  . 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 SYNOVIAL CYST 08/19/2007  . BACK PAIN, LUMBAR, WITH RADICULOPATHY 08/05/2007  . TOBACCO USER 08/02/2007  . HYPERTENSION 08/02/2007  . PARESTHESIA 08/02/2007    Past Surgical History:  Procedure Laterality Date  . ABDOMINAL HYSTERECTOMY    . BACK SURGERY    . BLADDER REPAIR       OB History   None      Home Medications    Prior to Admission medications   Medication Sig Start Date End Date Taking? Authorizing Provider  amLODipine (NORVASC) 10 MG tablet Take 10 mg by mouth daily.   Yes [provider]  ibuprofen (ADVIL,MOTRIN) 600 MG tablet Take 1 tablet (600 mg total) by mouth every 6 (six) hours as needed. 07/01/17  Yes Shary Decamp, PA-C  lisinopril (PRINIVIL,ZESTRIL) 10 MG tablet Take 10 mg by mouth daily.    Yes [provider]  cyclobenzaprine (FLEXERIL) 10 MG tablet Take 1 tablet (10 mg total) by mouth at bedtime. Patient not taking: Reported on 04/18/2018 03/24/18   Dalia Heading, PA-C  oxyCODONE-acetaminophen (PERCOCET/ROXICET) 5-325 MG tablet Take 1 tablet by mouth 2 (two) times daily as  needed for severe pain. Patient not taking: Reported on 04/18/2018 08/09/17   Suzan Slick, NP  predniSONE (DELTASONE) 50 MG tablet Take 1 tablet (50 mg total) by mouth daily. Patient not taking: Reported on 04/18/2018 03/24/18   Dalia Heading, PA-C  traMADol (ULTRAM) 50 MG tablet Take 1 tablet (50 mg total) by mouth every 6 (six) hours as needed for severe pain. Patient not taking: Reported on 04/18/2018 03/24/18   Dalia Heading, PA-C    Family History History reviewed. No pertinent family history.  Social History Social History   Tobacco Use  . Smoking status: Current Every Day Smoker    Packs/day: 0.50    Types:  Cigarettes  . Smokeless tobacco: Never Used  Substance Use Topics  . Alcohol use: No  . Drug use: No     Allergies   Other   Review of Systems Review of Systems  All other systems reviewed and are negative.    Physical Exam Updated Vital Signs BP (!) 110/98 (BP Location: Left Arm)   Pulse 93   Temp 98.5 F (36.9 C) (Oral)   Resp 18   SpO2 100%   Physical Exam  Constitutional: She appears well-developed and well-nourished.  Patient leaning over a chair appears uncomfortable.  HENT:  Head: Atraumatic.  Eyes: Conjunctivae are normal.  Neck: Neck supple.  Cardiovascular: Normal rate and regular rhythm.  Pulmonary/Chest: Effort normal and breath sounds normal.  Abdominal: Soft. She exhibits no distension. There is no tenderness.  Musculoskeletal: She exhibits tenderness (Tenderness along thoracic and lumbar spine on palpation without focal point tenderness.).  Neurological: She is alert.  Patellar deep tendon reflex intact bilaterally.  Able to ambulate.  Skin: No rash noted.  Psychiatric: She has a normal mood and affect.  Nursing note and vitals reviewed.    ED Treatments / Results  Labs (all labs ordered are listed, but only abnormal results are displayed) Labs Reviewed - No data to display  EKG None  Radiology No results found.  Procedures Procedures (including critical care time)  Medications Ordered in ED Medications - No data to display   Initial Impression / Assessment and Plan / ED Course  I have reviewed the triage vital signs and the nursing notes.  Pertinent labs & imaging results that were available during my care of the patient were reviewed by me and considered in my medical decision making (see chart for details).     BP (!) 110/98 (BP Location: Left Arm)   Pulse 93   Temp 98.5 F (36.9 C) (Oral)   Resp 18   SpO2 100%    Final Clinical Impressions(s) / ED Diagnoses   Final diagnoses:  Lower urinary tract infectious disease    Chronic midline back pain, unspecified back location    ED Discharge Orders        Ordered    cyclobenzaprine (FLEXERIL) 10 MG tablet  3 times daily PRN     04/18/18 1517    predniSONE (DELTASONE) 50 MG tablet  Daily     04/18/18 1517    cephALEXin (KEFLEX) 500 MG capsule  3 times daily     04/18/18 1517     1:44 PM Acute on chronic back pain, no recent injury, no red flags.  Pain is not adequately managed by NSAIDs at this time.  Plan to discharge home with muscle relaxant, and steroid.  Return precautions discussed.  3:13 PM UA obtained showing small amount of leukocyte Esterace, however patient has 21-50 WBC  in an otherwise clean urine.  Given her worsening back pain she would benefit from antibiotic as well. She will f/u with neurosurgeon DR. Cabbell for further management of her chronic back pain without red flags.  She is able to ambulate.     Domenic Moras, PA-C 04/18/18 1518    Charlesetta Shanks, MD 04/26/18 1524

## 2018-04-18 NOTE — ED Notes (Signed)
Patient refused discharge vitals.

## 2018-06-25 ENCOUNTER — Encounter (INDEPENDENT_AMBULATORY_CARE_PROVIDER_SITE_OTHER): Payer: Self-pay | Admitting: Orthopedic Surgery

## 2018-06-25 ENCOUNTER — Ambulatory Visit (INDEPENDENT_AMBULATORY_CARE_PROVIDER_SITE_OTHER): Payer: Medicaid Other

## 2018-06-25 ENCOUNTER — Ambulatory Visit (INDEPENDENT_AMBULATORY_CARE_PROVIDER_SITE_OTHER): Payer: Medicaid Other | Admitting: Orthopedic Surgery

## 2018-06-25 VITALS — Ht 64.0 in | Wt 159.0 lb

## 2018-06-25 DIAGNOSIS — G8929 Other chronic pain: Secondary | ICD-10-CM

## 2018-06-25 DIAGNOSIS — M25512 Pain in left shoulder: Secondary | ICD-10-CM

## 2018-07-09 ENCOUNTER — Encounter (INDEPENDENT_AMBULATORY_CARE_PROVIDER_SITE_OTHER): Payer: Self-pay | Admitting: Orthopedic Surgery

## 2018-07-09 DIAGNOSIS — G8929 Other chronic pain: Secondary | ICD-10-CM

## 2018-07-09 DIAGNOSIS — M25512 Pain in left shoulder: Secondary | ICD-10-CM

## 2018-07-09 MED ORDER — METHYLPREDNISOLONE ACETATE 40 MG/ML IJ SUSP
40.0000 mg | INTRAMUSCULAR | Status: AC | PRN
  Administered 2018-07-09: 40 mg via INTRA_ARTICULAR

## 2018-07-09 MED ORDER — LIDOCAINE HCL 1 % IJ SOLN
5.0000 mL | INTRAMUSCULAR | Status: AC | PRN
  Administered 2018-07-09: 5 mL

## 2018-07-09 NOTE — Progress Notes (Signed)
Office Visit Note   Patient: Rita Newton           Date of Birth: 04/19/1956           MRN: [id number] Visit Date: 06/25/2018              Requested by: No referring provider defined for this encounter. PCP: Patient, No Pcp Per  Chief Complaint  Patient presents with  . Left Shoulder - Pain      HPI: Patient is a 62 year old woman 1 year status post humeral neck fracture.  Patient complains of pain under the armpit radiates to her back.  She states she has decreased range of motion of her shoulder.  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  Patient states she is still smoking.  Assessment & Plan: Visit Diagnoses:  1. Chronic left shoulder pain     Plan: Patient underwent subacromial injection reevaluate in 4 weeks.  Importance of smoking cessation was discussed evaluate for possible surgical intervention and follow-up.  Follow-Up Instructions: Return in about 4 weeks (around 07/23/2018).   Ortho Exam  Patient is alert, oriented, no adenopathy, well-dressed, normal affect, normal respiratory effort. Examination patient has functional range of motion she can get her hand to the top of her head she does have pain with Neer and Hawkins impingement test patient has 45 degrees of internal and external rotation glenohumeral abduction and flexion of 70 degrees.  Imaging: No results found. No images are attached to the encounter.  Labs: No results found for: HGBA1C, ESRSEDRATE, CRP, LABURIC, REPTSTATUS, GRAMSTAIN, CULT, LABORGA   Lab Results  Component Value Date   ALBUMIN 3.8 08/13/2013   ALBUMIN 4.2 08/30/2010   ALBUMIN 3.2 (L) 02/21/2010    Body mass index is 27.29 kg/m.  Orders:  Orders Placed This Encounter  Procedures  . XR Shoulder 1V Left  . XR Humerus Left   No orders of the defined types were placed in this encounter.    Procedures: Large Joint Inj: L subacromial bursa on 07/09/2018 12:08 PM Indications: diagnostic evaluation and  pain Details: 22 G 1.5 in needle, posterior approach  Arthrogram: No  Medications: 5 mL lidocaine 1 %; 40 mg methylPREDNISolone acetate 40 MG/ML Outcome: tolerated well, no immediate complications Procedure, treatment alternatives, risks and benefits explained, specific risks discussed. Consent was given by the patient. Immediately prior to procedure a time out was called to verify the correct patient, procedure, equipment, support staff and site/side marked as required. Patient was prepped and draped in the usual sterile fashion.      Clinical Data: No additional findings.  ROS:  All other systems negative, except as noted in the HPI. Review of Systems  Objective: Vital Signs: Ht 5\' 4"  (1.626 m)   Wt 159 lb (72.1 kg)   BMI 27.29 kg/m   Specialty Comments:  No specialty comments available.  PMFS History: Patient Active Problem List   Diagnosis Date Noted  . Migraine   . Chronic pain   . OBESITY 08/30/2010  . DEPRESSION 08/30/2010  . MENOPAUSE, SURGICAL 06/20/2010  . FIBROIDS, UTERUS 09/20/2009  . ABDOMINAL PAIN, RIGHT LOWER QUADRANT 09/14/2009  . IRRITABLE BOWEL SYNDROME 01/11/2009  . LAMINECTOMY, LUMBAR, HX OF 06/18/2008  . DENTAL CARIES 09/16/2007  . 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 SYNOVIAL CYST 08/19/2007  . BACK PAIN, LUMBAR, WITH RADICULOPATHY 08/05/2007  . TOBACCO USER 08/02/2007  . HYPERTENSION 08/02/2007  . PARESTHESIA 08/02/2007   Past Medical History:  Diagnosis Date  . Chronic pain   . Depression   . Hypertension   . Migraine   . Sciatica     History reviewed. No pertinent family history.  Past Surgical History:  Procedure Laterality Date  . ABDOMINAL HYSTERECTOMY    . BACK SURGERY    . BLADDER REPAIR     Social History   Occupational History  . Not on file  Tobacco Use  . Smoking status: Current Every Day Smoker    Packs/day: 0.50    Types: Cigarettes  . Smokeless tobacco: Never Used  Substance  and Sexual Activity  . Alcohol use: No  . Drug use: No  . Sexual activity: Not on file

## 2018-07-11 ENCOUNTER — Telehealth (INDEPENDENT_AMBULATORY_CARE_PROVIDER_SITE_OTHER): Payer: Self-pay | Admitting: Orthopedic Surgery

## 2018-07-11 NOTE — Telephone Encounter (Signed)
Returned call to patient left message to return call to reschedule appointment.  [phone]

## 2018-07-11 NOTE — Telephone Encounter (Signed)
Scheduled Monday at 10 a.m.

## 2018-07-11 NOTE — Telephone Encounter (Signed)
Call patient let see if she can follow-up on Monday.  Need to discuss options.

## 2018-07-11 NOTE — Telephone Encounter (Signed)
This pt is s/p a shoulder injection 06/25/18. States that this has not helped and that the Advil she is taking is not helping. She was to follow up in 4 weeks to discuss possible surgical treatment. Pt is asking what she needs to do in the meanwhile.

## 2018-07-11 NOTE — Telephone Encounter (Signed)
Patient said cortisone injection has not helped and the advil she is taking isn't helping her pain either. Is there something else she can take or do in the mean time until her follow up appt? Patients # [phone]

## 2018-07-11 NOTE — Telephone Encounter (Signed)
Can you please call pt and make an appt for Monday to discuss treatment options?

## 2018-07-15 ENCOUNTER — Ambulatory Visit (INDEPENDENT_AMBULATORY_CARE_PROVIDER_SITE_OTHER): Payer: Medicaid Other

## 2018-07-15 ENCOUNTER — Ambulatory Visit (INDEPENDENT_AMBULATORY_CARE_PROVIDER_SITE_OTHER): Payer: Medicaid Other | Admitting: Orthopedic Surgery

## 2018-07-15 ENCOUNTER — Encounter (INDEPENDENT_AMBULATORY_CARE_PROVIDER_SITE_OTHER): Payer: Self-pay | Admitting: Orthopedic Surgery

## 2018-07-15 VITALS — Ht 64.0 in | Wt 159.0 lb

## 2018-07-15 DIAGNOSIS — M542 Cervicalgia: Secondary | ICD-10-CM | POA: Diagnosis not present

## 2018-07-15 MED ORDER — PREDNISONE 10 MG PO TABS: 20.0000 mg | ORAL_TABLET | Freq: Every day | ORAL | 0 refills | Status: DC

## 2018-07-15 NOTE — Progress Notes (Signed)
Office Visit Note   Patient: Rita Newton           Date of Birth: Dec 12, 1955           MRN: [id number] Visit Date: 07/15/2018              Requested by: No referring provider defined for this encounter. PCP: Patient, No Pcp Per  Chief Complaint  Patient presents with  . Left Shoulder - Follow-up  . Neck - Pain      HPI: Patient is a 62 year old woman who is status post left humeral neck fracture.  Patient states that she has had a history of lower back pain and remotely took prednisone for this while in the hospital and this resulted back symptoms.  Patient states that her pain primarily now is on the posterior lateral aspect of the left cervical spine.  Patient is used heat without relief.  She has had an injection in the shoulder without relief.  She complains of popping in her neck she states her arm feels better with her arm over her head.  Patient states that her neck pain has been present for several months.  Assessment & Plan: Visit Diagnoses:  1. Cervicalgia     Plan: We will start on prednisone 20 mg with breakfast continue with heat discussed that if this does not resolve we would need to get an MRI scan of her cervical spine.  She will wean off the prednisone as her symptoms resolved.  The importance of smoking cessation was discussed.  Follow-Up Instructions: Return in about 3 weeks (around 08/05/2018).   Ortho Exam  Patient is alert, oriented, no adenopathy, well-dressed, normal affect, normal respiratory effort. Examination patient has an antalgic gait.  She sits from resting with her arm elevated.  The thoracic outlet is not tender she is tender palpation paraspinous muscles the left side of her cervical spine.  She has decreased range of motion of her cervical spine.  The shoulder is not symptomatic there is no focal motor weakness in either upper extremity.  Patient has decreased grip strength on the left secondary to pain.  Patient states she is still  smoking.  Imaging: Xr Cervical Spine 2 Or 3 Views  Result Date: 07/15/2018 2 view radiographs of the cervical spine shows joint space narrowing no listhesis there are osteophytic bone spurs anteriorly.  Normal lordosis.  No images are attached to the encounter.  Labs: No results found for: HGBA1C, ESRSEDRATE, CRP, LABURIC, REPTSTATUS, GRAMSTAIN, CULT, LABORGA   Lab Results  Component Value Date   ALBUMIN 3.8 08/13/2013   ALBUMIN 4.2 08/30/2010   ALBUMIN 3.2 (L) 02/21/2010    Body mass index is 27.29 kg/m.  Orders:  Orders Placed This Encounter  Procedures  . XR Cervical Spine 2 or 3 views   Meds ordered this encounter  Medications  . predniSONE (DELTASONE) 10 MG tablet    Sig: Take 2 tablets (20 mg total) by mouth daily with breakfast.    Dispense:  60 tablet    Refill:  0     Procedures: No procedures performed  Clinical Data: No additional findings.  ROS:  All other systems negative, except as noted in the HPI. Review of Systems  Objective: Vital Signs: Ht 5\' 4"  (1.626 m)   Wt 159 lb (72.1 kg)   BMI 27.29 kg/m   Specialty Comments:  No specialty comments available.  PMFS History: Patient Active Problem List   Diagnosis Date Noted  . Migraine   .  Chronic pain   . OBESITY 08/30/2010  . DEPRESSION 08/30/2010  . MENOPAUSE, SURGICAL 06/20/2010  . FIBROIDS, UTERUS 09/20/2009  . ABDOMINAL PAIN, RIGHT LOWER QUADRANT 09/14/2009  . IRRITABLE BOWEL SYNDROME 01/11/2009  . LAMINECTOMY, LUMBAR, HX OF 06/18/2008  . DENTAL CARIES 09/16/2007  . 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 SYNOVIAL CYST 08/19/2007  . BACK PAIN, LUMBAR, WITH RADICULOPATHY 08/05/2007  . TOBACCO USER 08/02/2007  . HYPERTENSION 08/02/2007  . PARESTHESIA 08/02/2007   Past Medical History:  Diagnosis Date  . Chronic pain   . Depression   . Hypertension   . Migraine   . Sciatica     History reviewed. No pertinent family history.  Past Surgical  History:  Procedure Laterality Date  . ABDOMINAL HYSTERECTOMY    . BACK SURGERY    . BLADDER REPAIR     Social History   Occupational History  . Not on file  Tobacco Use  . Smoking status: Current Every Day Smoker    Packs/day: 0.50    Types: Cigarettes  . Smokeless tobacco: Never Used  Substance and Sexual Activity  . Alcohol use: No  . Drug use: No  . Sexual activity: Not on file

## 2018-07-25 ENCOUNTER — Ambulatory Visit (INDEPENDENT_AMBULATORY_CARE_PROVIDER_SITE_OTHER): Payer: Medicaid Other | Admitting: Orthopedic Surgery

## 2018-07-31 ENCOUNTER — Ambulatory Visit (INDEPENDENT_AMBULATORY_CARE_PROVIDER_SITE_OTHER): Payer: Medicaid Other | Admitting: Orthopedic Surgery

## 2018-08-06 ENCOUNTER — Telehealth (INDEPENDENT_AMBULATORY_CARE_PROVIDER_SITE_OTHER): Payer: Self-pay | Admitting: Orthopedic Surgery

## 2018-08-06 NOTE — Telephone Encounter (Signed)
Medication  Prednisone   Patient called in a lot pain medication currently not working. Ms.Wirtz was last seen on the on the 26th of August for Left Shoulder pain.

## 2018-08-06 NOTE — Telephone Encounter (Signed)
Please advise 

## 2018-08-06 NOTE — Telephone Encounter (Signed)
Have her come in tomorrow to see Raquel Sarna to evaluate for mri

## 2018-08-07 ENCOUNTER — Ambulatory Visit (INDEPENDENT_AMBULATORY_CARE_PROVIDER_SITE_OTHER): Payer: Medicaid Other

## 2018-08-07 ENCOUNTER — Encounter (INDEPENDENT_AMBULATORY_CARE_PROVIDER_SITE_OTHER): Payer: Self-pay | Admitting: Physician Assistant

## 2018-08-07 ENCOUNTER — Ambulatory Visit (INDEPENDENT_AMBULATORY_CARE_PROVIDER_SITE_OTHER): Payer: Medicaid Other | Admitting: Physician Assistant

## 2018-08-07 ENCOUNTER — Ambulatory Visit (INDEPENDENT_AMBULATORY_CARE_PROVIDER_SITE_OTHER): Payer: Medicaid Other | Admitting: Orthopedic Surgery

## 2018-08-07 DIAGNOSIS — G8929 Other chronic pain: Secondary | ICD-10-CM

## 2018-08-07 DIAGNOSIS — M25512 Pain in left shoulder: Secondary | ICD-10-CM | POA: Diagnosis not present

## 2018-08-07 DIAGNOSIS — M546 Pain in thoracic spine: Secondary | ICD-10-CM

## 2018-08-07 DIAGNOSIS — M542 Cervicalgia: Secondary | ICD-10-CM

## 2018-08-07 MED ORDER — TRAMADOL HCL 50 MG PO TABS: 50.0000 mg | ORAL_TABLET | Freq: Four times a day (QID) | ORAL | 0 refills | Status: DC | PRN

## 2018-08-07 MED ORDER — PREDNISONE 10 MG PO TABS: 20.0000 mg | ORAL_TABLET | Freq: Every day | ORAL | 0 refills | Status: DC

## 2018-08-07 NOTE — Telephone Encounter (Signed)
Appointment made with Shawn this afternoon at 1230.

## 2018-08-07 NOTE — Progress Notes (Signed)
Office Visit Note   Patient: Rita Newton           Date of Birth: 12/19/1955           MRN: [id number] Visit Date: 08/07/2018              Requested by: No referring provider defined for this encounter. PCP: Patient, No Pcp Per  Chief Complaint  Patient presents with  . Middle Back - Pain, Follow-up  . Left Shoulder - Pain, Follow-up      HPI: Patient is a 62 yo female who is seen in follow up for left shoulder pain and left upper/mid back pain. She reports she has been taking the Prednisone as prescribed, but has had no relief of her pain and if anything it has become more severe. She reports pain between the left scapula and left thoracic spine. She also reports chronic pain over the left shoulder ever since her shoulder fracture. She states the pain is worse with sitting and standing still. She reports she is unable to sleep and has to position her left arm very carefully on pillows, but this is not helping relieve her pain. She states her pain is very severe most of the time, but worst at the end of the day when she has been up all day. She reports pain is in the left armpit areaand across the anterior and posterior chest/scapular areas. She reports intermittent numbness and tingling in the left hand, but this is not a persistent problem. Advil and BC powders are also not helping.   Assessment & Plan: Visit Diagnoses:  1. Pain in thoracic spine   2. Cervicalgia   3. Chronic left shoulder pain     Plan: Continue Prednisone, planned cervical spine MRI scan to rule out nerve root impingement contributing to her left shoulder left scapular pain.  Did give her a prescription for Ultram 50 mg 1 p.o. every 6 hours as needed more severe pain only.  Continue on prednisone.  She will follow-up following results of her MRI.  Follow-Up Instructions: Return in about 2 weeks (around 08/21/2018).   Ortho Exam  Patient is alert, oriented, patient is very emotionally labile and crying off  and on during the exam. Patient is short of breath with general conversation.  She has an antalgic appearing gait.  She is tender over the left paraspinous region over the thoracic spine as well as over the cervical spine.  She has decreased active range of motion of her cervical spine.  There is no focal tenderness over the left shoulder.  There is no focal tenderness over the left scapula.  She is functional range of motion over the left shoulder but reports pain with any movement of the left shoulder passively she is intact to light touch right in the left upper extremity.  She has good radial brachial pulses.  Imaging: Xr Thoracic Spine 2 View  Result Date: 08/08/2018 2 view of the thoracic spine shows dorsal kyphosis without evidence of acute fractures.  She has arthritic changes with anterior osteophyte formation but overall good disc preservation.  No images are attached to the encounter.  Labs: No results found for: HGBA1C, ESRSEDRATE, CRP, LABURIC, REPTSTATUS, GRAMSTAIN, CULT, LABORGA   Lab Results  Component Value Date   ALBUMIN 3.8 08/13/2013   ALBUMIN 4.2 08/30/2010   ALBUMIN 3.2 (L) 02/21/2010    There is no height or weight on file to calculate BMI.  Orders:  Orders Placed This Encounter  Procedures  . XR Thoracic Spine 2 View  . MR Cervical Spine w/o contrast   Meds ordered this encounter  Medications  . traMADol (ULTRAM) 50 MG tablet    Sig: Take 1 tablet (50 mg total) by mouth every 6 (six) hours as needed for moderate pain or severe pain.    Dispense:  30 tablet    Refill:  0  . predniSONE (DELTASONE) 10 MG tablet    Sig: Take 2 tablets (20 mg total) by mouth daily with breakfast.    Dispense:  60 tablet    Refill:  0     Procedures: No procedures performed  Clinical Data: No additional findings.  ROS:  All other systems negative, except as noted in the HPI. Review of Systems  Objective: Vital Signs: There were no vitals taken for this  visit.  Specialty Comments:  No specialty comments available.  PMFS History: Patient Active Problem List   Diagnosis Date Noted  . Migraine   . Chronic pain   . OBESITY 08/30/2010  . DEPRESSION 08/30/2010  . MENOPAUSE, SURGICAL 06/20/2010  . FIBROIDS, UTERUS 09/20/2009  . ABDOMINAL PAIN, RIGHT LOWER QUADRANT 09/14/2009  . IRRITABLE BOWEL SYNDROME 01/11/2009  . LAMINECTOMY, LUMBAR, HX OF 06/18/2008  . DENTAL CARIES 09/16/2007  . 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 SYNOVIAL CYST 08/19/2007  . BACK PAIN, LUMBAR, WITH RADICULOPATHY 08/05/2007  . TOBACCO USER 08/02/2007  . HYPERTENSION 08/02/2007  . PARESTHESIA 08/02/2007   Past Medical History:  Diagnosis Date  . Chronic pain   . Depression   . Hypertension   . Migraine   . Sciatica     History reviewed. No pertinent family history.  Past Surgical History:  Procedure Laterality Date  . ABDOMINAL HYSTERECTOMY    . BACK SURGERY    . BLADDER REPAIR     Social History   Occupational History  . Not on file  Tobacco Use  . Smoking status: Current Every Day Smoker    Packs/day: 0.50    Types: Cigarettes  . Smokeless tobacco: Never Used  Substance and Sexual Activity  . Alcohol use: No  . Drug use: No  . Sexual activity: Not on file

## 2018-08-08 ENCOUNTER — Encounter (INDEPENDENT_AMBULATORY_CARE_PROVIDER_SITE_OTHER): Payer: Self-pay | Admitting: Physician Assistant

## 2018-08-14 ENCOUNTER — Ambulatory Visit (INDEPENDENT_AMBULATORY_CARE_PROVIDER_SITE_OTHER): Payer: Medicaid Other | Admitting: Family

## 2018-08-26 ENCOUNTER — Telehealth (INDEPENDENT_AMBULATORY_CARE_PROVIDER_SITE_OTHER): Payer: Self-pay | Admitting: Orthopedic Surgery

## 2018-08-26 NOTE — Telephone Encounter (Signed)
See below and advise

## 2018-08-26 NOTE — Telephone Encounter (Signed)
Patient called for refill on predniSONE & traMADol.  Please call patient to advise MRI is not sch until 09/04/18  [phone]

## 2018-08-26 NOTE — Telephone Encounter (Signed)
Ok refill? 

## 2018-08-27 ENCOUNTER — Other Ambulatory Visit (INDEPENDENT_AMBULATORY_CARE_PROVIDER_SITE_OTHER): Payer: Self-pay

## 2018-08-27 MED ORDER — PREDNISONE 10 MG PO TABS: 20.0000 mg | ORAL_TABLET | Freq: Every day | ORAL | 0 refills | Status: DC

## 2018-08-27 MED ORDER — TRAMADOL HCL 50 MG PO TABS: 50.0000 mg | ORAL_TABLET | Freq: Four times a day (QID) | ORAL | 0 refills | Status: DC | PRN

## 2018-08-27 NOTE — Telephone Encounter (Signed)
rx in system faxed to [facility] faxed to pharm and tramadol called in

## 2018-09-04 ENCOUNTER — Ambulatory Visit
Admission: RE | Admit: 2018-09-04 | Discharge: 2018-09-04 | Disposition: A | Discharge status: Home / Self Care | Payer: Medicaid Other | Source: Ambulatory Visit | Attending: Physician Assistant | Admitting: Physician Assistant

## 2018-09-04 DIAGNOSIS — M542 Cervicalgia: Secondary | ICD-10-CM

## 2018-09-11 ENCOUNTER — Ambulatory Visit (INDEPENDENT_AMBULATORY_CARE_PROVIDER_SITE_OTHER): Payer: Medicaid Other | Admitting: Orthopedic Surgery

## 2018-09-11 ENCOUNTER — Encounter (INDEPENDENT_AMBULATORY_CARE_PROVIDER_SITE_OTHER): Payer: Self-pay | Admitting: Orthopedic Surgery

## 2018-09-11 VITALS — Ht 64.0 in | Wt 159.0 lb

## 2018-09-11 DIAGNOSIS — M7502 Adhesive capsulitis of left shoulder: Secondary | ICD-10-CM | POA: Diagnosis not present

## 2018-09-11 DIAGNOSIS — M25512 Pain in left shoulder: Secondary | ICD-10-CM

## 2018-09-11 DIAGNOSIS — G8929 Other chronic pain: Secondary | ICD-10-CM | POA: Diagnosis not present

## 2018-09-11 MED ORDER — TRAMADOL HCL 50 MG PO TABS: 50.0000 mg | ORAL_TABLET | Freq: Four times a day (QID) | ORAL | 0 refills | Status: DC | PRN

## 2018-09-11 NOTE — Progress Notes (Signed)
Office Visit Note   Patient: Rita Newton           Date of Birth: 28-Sep-1956           MRN: [id number] Visit Date: 09/11/2018              Requested by: No referring provider defined for this encounter. PCP: Patient, No Pcp Per  Chief Complaint  Patient presents with  . Spine - Follow-up    MRI Review Cervical Spine      HPI: Patient is a 62 year old woman whose had a remote nondisplaced proximal humerus fracture on the left.  Patient complains of pain from the neck of the shoulder radiating down her arm.  Previously her symptoms seem more radicular in nature and patient is status post an MRI scan.  Patient states that this time she has lost range of motion of her shoulder she tries to elevate it using her other hand without relief.  She states the previous subacromial injection provided her no relief she states the prednisone has provided very minimal relief.  Assessment & Plan: Visit Diagnoses:  1. Chronic left shoulder pain   2. Adhesive capsulitis of left shoulder     Plan: Discussed with the patient with her loss of range of motion of the left shoulder she does have adhesive capsulitis at this time.  With her significant symptoms feel the best option would be to proceed with arthroscopic debridement of the impingement with the adhesive capsulitis.  Discussed the importance of continuing with smoking cessation.  Discussed risks and benefits including persistent pain need for additional surgery.  Patient states she understands wished to proceed at this time she was given a prescription for tramadol for her pain.  Follow-Up Instructions: Return in about 2 weeks (around 09/25/2018).   Ortho Exam  Patient is alert, oriented, no adenopathy, well-dressed, normal affect, normal respiratory effort. Examination patient only has abduction and flexion of left shoulder at this time of 70 degrees.  She has pain with Neer and Hawkins impingement test has 45 degrees of internal and  external rotation.  The thoracic outlet is minimally tender to palpation of the left.  She has good motor strength in all motor groups of both upper extremities and this is symmetric bilaterally.  Review of the MRI scan shows no disc pathology in the cervical spine.  Patient states she feels better with being in a reclining chair which consistent with the impingement of the left shoulder.  Patient has to manually lift her left arm to get it overhead but there is no motor weakness.  Patient has pain with Neer and Hawkins impingement test pain with a drop arm test.  Imaging: No results found. No images are attached to the encounter.  Labs: No results found for: HGBA1C, ESRSEDRATE, CRP, LABURIC, REPTSTATUS, GRAMSTAIN, CULT, LABORGA   Lab Results  Component Value Date   ALBUMIN 3.8 08/13/2013   ALBUMIN 4.2 08/30/2010   ALBUMIN 3.2 (L) 02/21/2010    Body mass index is 27.29 kg/m.  Orders:  No orders of the defined types were placed in this encounter.  Meds ordered this encounter  Medications  . traMADol (ULTRAM) 50 MG tablet    Sig: Take 1 tablet (50 mg total) by mouth every 6 (six) hours as needed for moderate pain or severe pain.    Dispense:  30 tablet    Refill:  0     Procedures: No procedures performed  Clinical Data: No additional findings.  ROS:  All other systems negative, except as noted in the HPI. Review of Systems  Objective: Vital Signs: Ht 5\' 4"  (1.626 m)   Wt 159 lb (72.1 kg)   BMI 27.29 kg/m   Specialty Comments:  No specialty comments available.  PMFS History: Patient Active Problem List   Diagnosis Date Noted  . Migraine   . Chronic pain   . OBESITY 08/30/2010  . DEPRESSION 08/30/2010  . MENOPAUSE, SURGICAL 06/20/2010  . FIBROIDS, UTERUS 09/20/2009  . ABDOMINAL PAIN, RIGHT LOWER QUADRANT 09/14/2009  . IRRITABLE BOWEL SYNDROME 01/11/2009  . LAMINECTOMY, LUMBAR, HX OF 06/18/2008  . DENTAL CARIES 09/16/2007  . DEGENERATIVE DISC DISEASE,  LUMBAR SPINE, WITH MYELOPATHY 08/19/2007  . SYNOVIAL CYST 08/19/2007  . BACK PAIN, LUMBAR, WITH RADICULOPATHY 08/05/2007  . TOBACCO USER 08/02/2007  . HYPERTENSION 08/02/2007  . PARESTHESIA 08/02/2007   Past Medical History:  Diagnosis Date  . Chronic pain   . Depression   . Hypertension   . Migraine   . Sciatica     History reviewed. No pertinent family history.  Past Surgical History:  Procedure Laterality Date  . ABDOMINAL HYSTERECTOMY    . BACK SURGERY    . BLADDER REPAIR     Social History   Occupational History  . Not on file  Tobacco Use  . Smoking status: Current Every Day Smoker    Packs/day: 0.50    Types: Cigarettes  . Smokeless tobacco: Never Used  Substance and Sexual Activity  . Alcohol use: No  . Drug use: No  . Sexual activity: Not on file

## 2018-09-23 ENCOUNTER — Other Ambulatory Visit (INDEPENDENT_AMBULATORY_CARE_PROVIDER_SITE_OTHER): Payer: Self-pay | Admitting: Orthopedic Surgery

## 2018-09-23 NOTE — Telephone Encounter (Signed)
Ok refill? 

## 2018-09-23 NOTE — Telephone Encounter (Signed)
Do you want to refill? 

## 2018-10-03 ENCOUNTER — Other Ambulatory Visit (INDEPENDENT_AMBULATORY_CARE_PROVIDER_SITE_OTHER): Payer: Self-pay | Admitting: Orthopedic Surgery

## 2018-10-03 NOTE — Telephone Encounter (Signed)
Do you want to refill? 

## 2018-10-03 NOTE — Telephone Encounter (Signed)
Ok refill? 

## 2018-10-14 ENCOUNTER — Other Ambulatory Visit (INDEPENDENT_AMBULATORY_CARE_PROVIDER_SITE_OTHER): Payer: Self-pay | Admitting: Orthopedic Surgery

## 2018-10-14 NOTE — Telephone Encounter (Signed)
Do you wish to refill? Received #30 10/04/18. Frozen shoulder and was to set up surgery. Last office visit was the end of October.

## 2018-10-25 ENCOUNTER — Other Ambulatory Visit (INDEPENDENT_AMBULATORY_CARE_PROVIDER_SITE_OTHER): Payer: Self-pay | Admitting: Orthopedic Surgery

## 2018-10-28 NOTE — Telephone Encounter (Signed)
Do you want to refill? 

## 2018-10-30 ENCOUNTER — Telehealth (INDEPENDENT_AMBULATORY_CARE_PROVIDER_SITE_OTHER): Payer: Self-pay | Admitting: Orthopedic Surgery

## 2018-10-30 NOTE — Telephone Encounter (Signed)
Serena Colonel from [facility] left a message stating that they need a new RX due to the Tramadol being a controlled medication.  Their number is [phone].  Thank you.

## 2018-10-31 ENCOUNTER — Other Ambulatory Visit (INDEPENDENT_AMBULATORY_CARE_PROVIDER_SITE_OTHER): Payer: Self-pay | Admitting: Orthopedic Surgery

## 2018-10-31 MED ORDER — TRAMADOL HCL 50 MG PO TABS: 50.0000 mg | ORAL_TABLET | Freq: Four times a day (QID) | ORAL | 0 refills | Status: DC | PRN

## 2018-10-31 NOTE — Telephone Encounter (Signed)
I called the pharmacy and they state that the rx for tramadol that was sent on 10/28/18 was off of a refill request. They need a new rx for this to be filled for the pt. Please write rx and send with finger print reader.

## 2018-11-06 ENCOUNTER — Other Ambulatory Visit (INDEPENDENT_AMBULATORY_CARE_PROVIDER_SITE_OTHER): Payer: Self-pay | Admitting: Orthopedic Surgery

## 2018-11-06 NOTE — Telephone Encounter (Signed)
Do you want to refill? 

## 2018-11-18 ENCOUNTER — Telehealth (INDEPENDENT_AMBULATORY_CARE_PROVIDER_SITE_OTHER): Payer: Self-pay

## 2018-11-18 ENCOUNTER — Other Ambulatory Visit (INDEPENDENT_AMBULATORY_CARE_PROVIDER_SITE_OTHER): Payer: Self-pay | Admitting: Orthopedic Surgery

## 2018-11-18 NOTE — Telephone Encounter (Signed)
Do you want to refill? 

## 2018-11-18 NOTE — Telephone Encounter (Signed)
Can you please call pt and make an appt for eval Dr Sharol Given denied refill needs follow up in office.

## 2018-11-18 NOTE — Telephone Encounter (Signed)
Lets see her, I don't know if she is having shoulder pain or radicular cervical pain

## 2018-11-18 NOTE — Telephone Encounter (Signed)
Patient would like a RF on Tramadol and Prednisone. She uses [facility] [address].   CB [phone]

## 2018-11-21 ENCOUNTER — Telehealth (INDEPENDENT_AMBULATORY_CARE_PROVIDER_SITE_OTHER): Payer: Self-pay | Admitting: Orthopedic Surgery

## 2018-11-21 NOTE — Telephone Encounter (Signed)
Patient called stating that she has been up for two nights and would like for Dr. Sharol Given to call in the Prednisone at least if not could he call in the Tramadol for her so she can get some sleep.  CB#[phone].  Thank you.

## 2018-11-21 NOTE — Telephone Encounter (Signed)
I called and pt's daughter answered phone. She is not listed on her HIPPA pt's daughter gave me another number to reach the pt [phone] I called and lm on vm to advise that the pt has an appt on Monday. Dr. Sharol Given was not sure if the discomfort was coming from her back or her shoulder and the pt has not been in the office since October and requires eval first to decide on the best treatment options. To call with questions.

## 2018-11-25 ENCOUNTER — Ambulatory Visit (INDEPENDENT_AMBULATORY_CARE_PROVIDER_SITE_OTHER): Payer: Medicaid Other | Admitting: Orthopedic Surgery

## 2018-11-25 ENCOUNTER — Encounter (INDEPENDENT_AMBULATORY_CARE_PROVIDER_SITE_OTHER): Payer: Self-pay | Admitting: Orthopedic Surgery

## 2018-11-25 VITALS — Ht 64.0 in | Wt 159.0 lb

## 2018-11-25 DIAGNOSIS — M7502 Adhesive capsulitis of left shoulder: Secondary | ICD-10-CM | POA: Diagnosis not present

## 2018-11-25 MED ORDER — TRAMADOL HCL 50 MG PO TABS: 50.0000 mg | ORAL_TABLET | Freq: Four times a day (QID) | ORAL | 0 refills | Status: DC | PRN

## 2018-11-25 NOTE — Progress Notes (Signed)
Office Visit Note   Patient: Rita Newton           Date of Birth: July 03, 1956           MRN: [id number] Visit Date: 11/25/2018              Requested by: No referring provider defined for this encounter. PCP: Patient, No Pcp Per  Chief Complaint  Patient presents with  . Left Shoulder - Pain, Follow-up      HPI: Patient is a 63 year old woman who presents complaining of adhesive capsulitis and pain with the right shoulder.  Patient states she has pain with all activities of daily living.  She states that injections prednisone and Ultram have not resolved her symptoms.  Assessment & Plan: Visit Diagnoses:  1. Adhesive capsulitis of left shoulder     Plan: Patient states she would like to proceed with arthroscopic intervention risks and benefits of surgery were discussed including infection neurovascular injury persistent pain need for additional surgery.  Patient states she understands wished to proceed at this time.  Follow-Up Instructions: Return in about 2 weeks (around 12/09/2018).   Ortho Exam  Patient is alert, oriented, no adenopathy, well-dressed, normal affect, normal respiratory effort. Examination patient has active abduction and flexion of the left shoulder to 70 degrees passively I can get her to 90 degrees.  She has pain with Neer and Hawkins impingement test pain with a drop arm test she only has glenohumeral range of motion to 70 degrees she has external rotation of 45 degrees internal rotation of 10 degrees these are all painful.  Imaging: No results found. No images are attached to the encounter.  Labs: No results found for: HGBA1C, ESRSEDRATE, CRP, LABURIC, REPTSTATUS, GRAMSTAIN, CULT, LABORGA   Lab Results  Component Value Date   ALBUMIN 3.8 08/13/2013   ALBUMIN 4.2 08/30/2010   ALBUMIN 3.2 (L) 02/21/2010    Body mass index is 27.29 kg/m.  Orders:  No orders of the defined types were placed in this encounter.  Meds ordered this encounter    Medications  . traMADol (ULTRAM) 50 MG tablet    Sig: Take 1 tablet (50 mg total) by mouth every 6 (six) hours as needed for moderate pain.    Dispense:  30 tablet    Refill:  0     Procedures: No procedures performed  Clinical Data: No additional findings.  ROS:  All other systems negative, except as noted in the HPI. Review of Systems  Objective: Vital Signs: Ht 5\' 4"  (1.626 m)   Wt 159 lb (72.1 kg)   BMI 27.29 kg/m   Specialty Comments:  No specialty comments available.  PMFS History: Patient Active Problem List   Diagnosis Date Noted  . Migraine   . Chronic pain   . OBESITY 08/30/2010  . DEPRESSION 08/30/2010  . MENOPAUSE, SURGICAL 06/20/2010  . FIBROIDS, UTERUS 09/20/2009  . ABDOMINAL PAIN, RIGHT LOWER QUADRANT 09/14/2009  . IRRITABLE BOWEL SYNDROME 01/11/2009  . LAMINECTOMY, LUMBAR, HX OF 06/18/2008  . DENTAL CARIES 09/16/2007  . 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 SYNOVIAL CYST 08/19/2007  . BACK PAIN, LUMBAR, WITH RADICULOPATHY 08/05/2007  . TOBACCO USER 08/02/2007  . HYPERTENSION 08/02/2007  . PARESTHESIA 08/02/2007   Past Medical History:  Diagnosis Date  . Chronic pain   . Depression   . Hypertension   . Migraine   . Sciatica     History reviewed. No pertinent family history.  Past Surgical  History:  Procedure Laterality Date  . ABDOMINAL HYSTERECTOMY    . BACK SURGERY    . BLADDER REPAIR     Social History   Occupational History  . Not on file  Tobacco Use  . Smoking status: Current Every Day Smoker    Packs/day: 0.50    Types: Cigarettes  . Smokeless tobacco: Never Used  Substance and Sexual Activity  . Alcohol use: No  . Drug use: No  . Sexual activity: Not on file

## 2018-12-10 ENCOUNTER — Ambulatory Visit (INDEPENDENT_AMBULATORY_CARE_PROVIDER_SITE_OTHER): Payer: Self-pay | Admitting: Physician Assistant

## 2018-12-12 ENCOUNTER — Other Ambulatory Visit: Payer: Self-pay

## 2018-12-12 ENCOUNTER — Encounter (HOSPITAL_COMMUNITY): Payer: Self-pay | Admitting: *Deleted

## 2018-12-12 NOTE — Progress Notes (Signed)
Ms Guizar denies chest pain or shortness of breath.  Patient has a history of HTN, but is no longer on blod pressure medications.  I requested records from [facility] [place].

## 2018-12-12 NOTE — Anesthesia Preprocedure Evaluation (Signed)
Anesthesia Evaluation    Reviewed: Allergy & Precautions, H&P , Patient's Chart, lab work & pertinent test results  Airway Mallampati: II  TM Distance: >3 FB Neck ROM: Full    Dental no notable dental hx.    Pulmonary pneumonia, Current Smoker,    Pulmonary exam normal breath sounds clear to auscultation       Cardiovascular Exercise Tolerance: Good hypertension, Pt. on medications negative cardio ROS Normal cardiovascular exam Rhythm:Regular Rate:Normal     Neuro/Psych  Headaches, PSYCHIATRIC DISORDERS Anxiety Depression  Neuromuscular disease    GI/Hepatic negative GI ROS, Neg liver ROS,   Endo/Other  negative endocrine ROS  Renal/GU negative Renal ROS  negative genitourinary   Musculoskeletal  (+) Arthritis , Osteoarthritis,    Abdominal   Peds  Hematology negative hematology ROS (+)   Anesthesia Other Findings   Reproductive/Obstetrics negative OB ROS                             Anesthesia Physical Anesthesia Plan  ASA: II  Anesthesia Plan: General   Post-op Pain Management: GA combined w/ Regional for post-op pain   Induction:   PONV Risk Score and Plan: 3 and Ondansetron, Dexamethasone, Treatment may vary due to age or medical condition and Midazolam  Airway Management Planned: Oral ETT and LMA  Additional Equipment:   Intra-op Plan:   Post-operative Plan: Extubation in OR  Informed Consent: I have reviewed the patients History and Physical, chart, labs and discussed the procedure including the risks, benefits and alternatives for the proposed anesthesia with the patient or authorized representative who has indicated his/her understanding and acceptance.     Dental advisory given  Plan Discussed with: CRNA, Anesthesiologist and Surgeon  Anesthesia Plan Comments: (Discussed both nerve block for pain relief post-op and GA; including NV, sore throat, dental injury, and  pulmonary complications)        Anesthesia Quick Evaluation

## 2018-12-13 ENCOUNTER — Encounter (HOSPITAL_COMMUNITY): Payer: Self-pay | Admitting: *Deleted

## 2018-12-13 ENCOUNTER — Ambulatory Visit (HOSPITAL_COMMUNITY)
Admission: RE | Admit: 2018-12-13 | Discharge: 2018-12-13 | Disposition: A | Discharge status: Home / Self Care | Payer: Medicaid Other | Attending: Orthopedic Surgery | Admitting: Orthopedic Surgery

## 2018-12-13 ENCOUNTER — Encounter (HOSPITAL_COMMUNITY)
Admission: RE | Disposition: A | Discharge status: Home / Self Care | Payer: Self-pay | Source: Home / Self Care | Attending: Orthopedic Surgery

## 2018-12-13 ENCOUNTER — Ambulatory Visit (HOSPITAL_COMMUNITY): Payer: Medicaid Other | Admitting: Anesthesiology

## 2018-12-13 DIAGNOSIS — M24612 Ankylosis, left shoulder: Secondary | ICD-10-CM | POA: Insufficient documentation

## 2018-12-13 DIAGNOSIS — M7502 Adhesive capsulitis of left shoulder: Secondary | ICD-10-CM | POA: Insufficient documentation

## 2018-12-13 DIAGNOSIS — I10 Essential (primary) hypertension: Secondary | ICD-10-CM | POA: Diagnosis not present

## 2018-12-13 DIAGNOSIS — M7542 Impingement syndrome of left shoulder: Secondary | ICD-10-CM | POA: Diagnosis not present

## 2018-12-13 DIAGNOSIS — Z79899 Other long term (current) drug therapy: Secondary | ICD-10-CM | POA: Diagnosis not present

## 2018-12-13 DIAGNOSIS — F17210 Nicotine dependence, cigarettes, uncomplicated: Secondary | ICD-10-CM | POA: Insufficient documentation

## 2018-12-13 DIAGNOSIS — S46012A Strain of muscle(s) and tendon(s) of the rotator cuff of left shoulder, initial encounter: Secondary | ICD-10-CM

## 2018-12-13 HISTORY — PX: SHOULDER ARTHROSCOPY: SHX128

## 2018-12-13 HISTORY — DX: Panic disorder (episodic paroxysmal anxiety): F41.0

## 2018-12-13 HISTORY — DX: Pneumonia, unspecified organism: J18.9

## 2018-12-13 LAB — BASIC METABOLIC PANEL
Anion gap: 11 (ref 5–15)
BUN: 5 mg/dL — ABNORMAL LOW (ref 8–23)
CO2: 21 mmol/L — ABNORMAL LOW (ref 22–32)
Calcium: 8.9 mg/dL (ref 8.9–10.3)
Chloride: 108 mmol/L (ref 98–111)
Creatinine, Ser: 0.63 mg/dL (ref 0.44–1.00)
GFR calc Af Amer: >60 mL/min (ref >60)
GFR calc non Af Amer: >60 mL/min (ref >60)
Glucose, Bld: 101 mg/dL — ABNORMAL HIGH (ref 70–99)
Potassium: 3 mmol/L — ABNORMAL LOW (ref 3.5–5.1)
Sodium: 140 mmol/L (ref 135–145)

## 2018-12-13 LAB — CBC
HCT: 38.4 % (ref 36.0–46.0)
Hemoglobin: 12.7 g/dL (ref 12.0–15.0)
MCH: 30.3 pg (ref 26.0–34.0)
MCHC: 33.1 g/dL (ref 30.0–36.0)
MCV: 91.6 fL (ref 80.0–100.0)
NRBC: 0 % (ref 0.0–0.2)
Platelets: 270 10*3/uL (ref 150–400)
RBC: 4.19 MIL/uL (ref 3.87–5.11)
RDW: 12.7 % (ref 11.5–15.5)
WBC: 4.2 10*3/uL (ref 4.0–10.5)

## 2018-12-13 SURGERY — ARTHROSCOPY, SHOULDER
Anesthesia: General | Laterality: Left

## 2018-12-13 MED ORDER — ARTIFICIAL TEARS OPHTHALMIC OINT
TOPICAL_OINTMENT | OPHTHALMIC | Status: DC | PRN
  Administered 2018-12-13: 1 via OPHTHALMIC

## 2018-12-13 MED ORDER — DEXAMETHASONE SODIUM PHOSPHATE 10 MG/ML IJ SOLN
INTRAMUSCULAR | Status: DC | PRN
  Administered 2018-12-13: 10 mg via INTRAVENOUS

## 2018-12-13 MED ORDER — 0.9 % SODIUM CHLORIDE (POUR BTL) OPTIME
TOPICAL | Status: DC | PRN
  Administered 2018-12-13: 1000 mL

## 2018-12-13 MED ORDER — LIDOCAINE 2% (20 MG/ML) 5 ML SYRINGE
INTRAMUSCULAR | Status: AC
  Filled 2018-12-13: qty 5

## 2018-12-13 MED ORDER — ROCURONIUM 10MG/ML (10ML) SYRINGE FOR MEDFUSION PUMP - OPTIME
INTRAVENOUS | Status: DC | PRN
  Administered 2018-12-13: 40 mg via INTRAVENOUS

## 2018-12-13 MED ORDER — EPHEDRINE SULFATE 50 MG/ML IJ SOLN
INTRAMUSCULAR | Status: DC | PRN
  Administered 2018-12-13: 10 mg via INTRAVENOUS

## 2018-12-13 MED ORDER — CHLORHEXIDINE GLUCONATE 4 % EX LIQD: 60.0000 mL | Freq: Once | CUTANEOUS | Status: DC

## 2018-12-13 MED ORDER — OXYCODONE HCL 5 MG PO TABS
ORAL_TABLET | ORAL | Status: AC
  Filled 2018-12-13: qty 1

## 2018-12-13 MED ORDER — SODIUM CHLORIDE 0.9 % IR SOLN
Status: DC | PRN
  Administered 2018-12-13 (×2): 3000 mL

## 2018-12-13 MED ORDER — FENTANYL CITRATE (PF) 250 MCG/5ML IJ SOLN
INTRAMUSCULAR | Status: AC
  Filled 2018-12-13: qty 5

## 2018-12-13 MED ORDER — SUGAMMADEX SODIUM 200 MG/2ML IV SOLN: INTRAVENOUS | Status: DC | PRN

## 2018-12-13 MED ORDER — MIDAZOLAM HCL 5 MG/5ML IJ SOLN
INTRAMUSCULAR | Status: DC | PRN
  Administered 2018-12-13: 2 mg via INTRAVENOUS

## 2018-12-13 MED ORDER — ONDANSETRON HCL 4 MG/2ML IJ SOLN: 4.0000 mg | Freq: Once | INTRAMUSCULAR | Status: DC | PRN

## 2018-12-13 MED ORDER — DEXAMETHASONE SODIUM PHOSPHATE 10 MG/ML IJ SOLN
INTRAMUSCULAR | Status: AC
  Filled 2018-12-13: qty 1

## 2018-12-13 MED ORDER — MIDAZOLAM HCL 2 MG/2ML IJ SOLN
INTRAMUSCULAR | Status: AC
  Filled 2018-12-13: qty 2

## 2018-12-13 MED ORDER — ACETAMINOPHEN 325 MG PO TABS: 325.0000 mg | ORAL_TABLET | ORAL | Status: DC | PRN

## 2018-12-13 MED ORDER — PHENYLEPHRINE HCL 10 MG/ML IJ SOLN
INTRAMUSCULAR | Status: DC | PRN
  Administered 2018-12-13 (×3): 80 ug via INTRAVENOUS

## 2018-12-13 MED ORDER — HYDROCODONE-ACETAMINOPHEN 5-325 MG PO TABS: 1.0000 | ORAL_TABLET | ORAL | 0 refills | Status: DC | PRN

## 2018-12-13 MED ORDER — ONDANSETRON HCL 4 MG/2ML IJ SOLN
INTRAMUSCULAR | Status: AC
  Filled 2018-12-13: qty 2

## 2018-12-13 MED ORDER — ACETAMINOPHEN 160 MG/5ML PO SOLN: 325.0000 mg | ORAL | Status: DC | PRN

## 2018-12-13 MED ORDER — PROPOFOL 10 MG/ML IV BOLUS
INTRAVENOUS | Status: AC
  Filled 2018-12-13: qty 20

## 2018-12-13 MED ORDER — CEFAZOLIN SODIUM-DEXTROSE 2-4 GM/100ML-% IV SOLN
2.0000 g | INTRAVENOUS | Status: AC
  Administered 2018-12-13: 2 g via INTRAVENOUS
  Filled 2018-12-13: qty 100

## 2018-12-13 MED ORDER — PROPOFOL 10 MG/ML IV BOLUS
INTRAVENOUS | Status: DC | PRN
  Administered 2018-12-13: 150 mg via INTRAVENOUS

## 2018-12-13 MED ORDER — MEPERIDINE HCL 50 MG/ML IJ SOLN: 6.2500 mg | INTRAMUSCULAR | Status: DC | PRN

## 2018-12-13 MED ORDER — FENTANYL CITRATE (PF) 100 MCG/2ML IJ SOLN
INTRAMUSCULAR | Status: DC | PRN
  Administered 2018-12-13 (×4): 50 ug via INTRAVENOUS

## 2018-12-13 MED ORDER — OXYCODONE HCL 5 MG/5ML PO SOLN: 5.0000 mg | Freq: Once | ORAL | Status: AC | PRN

## 2018-12-13 MED ORDER — FENTANYL CITRATE (PF) 100 MCG/2ML IJ SOLN
25.0000 ug | INTRAMUSCULAR | Status: DC | PRN
  Administered 2018-12-13 (×2): 50 ug via INTRAVENOUS

## 2018-12-13 MED ORDER — SUGAMMADEX SODIUM 200 MG/2ML IV SOLN
INTRAVENOUS | Status: DC | PRN
  Administered 2018-12-13: 100 mg via INTRAVENOUS
  Administered 2018-12-13: 200 mg via INTRAVENOUS

## 2018-12-13 MED ORDER — STERILE WATER FOR IRRIGATION IR SOLN
Status: DC | PRN
  Administered 2018-12-13: 1000 mL

## 2018-12-13 MED ORDER — SODIUM CHLORIDE 0.9 % IV SOLN
INTRAVENOUS | Status: DC | PRN
  Administered 2018-12-13: 30 ug/min via INTRAVENOUS

## 2018-12-13 MED ORDER — BUPIVACAINE HCL (PF) 0.5 % IJ SOLN
INTRAMUSCULAR | Status: DC | PRN
  Administered 2018-12-13: 20 mL via PERINEURAL

## 2018-12-13 MED ORDER — ONDANSETRON HCL 4 MG/2ML IJ SOLN
INTRAMUSCULAR | Status: DC | PRN
  Administered 2018-12-13: 4 mg via INTRAVENOUS

## 2018-12-13 MED ORDER — LACTATED RINGERS IV SOLN
INTRAVENOUS | Status: DC | PRN
  Administered 2018-12-13: 07:00:00 via INTRAVENOUS

## 2018-12-13 MED ORDER — BUPIVACAINE LIPOSOME 1.3 % IJ SUSP
INTRAMUSCULAR | Status: DC | PRN
  Administered 2018-12-13: 10 mL via PERINEURAL

## 2018-12-13 MED ORDER — FENTANYL CITRATE (PF) 100 MCG/2ML IJ SOLN
INTRAMUSCULAR | Status: AC
  Filled 2018-12-13: qty 2

## 2018-12-13 MED ORDER — OXYCODONE HCL 5 MG PO TABS
5.0000 mg | ORAL_TABLET | Freq: Once | ORAL | Status: AC | PRN
  Administered 2018-12-13: 5 mg via ORAL

## 2018-12-13 SURGICAL SUPPLY — 36 items
BLADE GREAT WHITE 4.2 (BLADE) IMPLANT
BLADE GREAT WHITE 4.2MM (BLADE)
BUR OVAL 6.0 (BURR) ×3 IMPLANT
CANNULA SHOULDER 7CM (CANNULA) ×3 IMPLANT
COVER SURGICAL LIGHT HANDLE (MISCELLANEOUS) ×6 IMPLANT
COVER WAND RF STERILE (DRAPES) ×3 IMPLANT
DRAPE STERI 35X30 U-POUCH (DRAPES) ×3 IMPLANT
DRAPE U-SHAPE 47X51 STRL (DRAPES) ×3 IMPLANT
DRSG EMULSION OIL 3X3 NADH (GAUZE/BANDAGES/DRESSINGS) ×3 IMPLANT
DRSG PAD ABDOMINAL 8X10 ST (GAUZE/BANDAGES/DRESSINGS) ×6 IMPLANT
DURAPREP 26ML APPLICATOR (WOUND CARE) ×3 IMPLANT
GAUZE SPONGE 4X4 12PLY STRL (GAUZE/BANDAGES/DRESSINGS) ×3 IMPLANT
GAUZE SPONGE 4X4 12PLY STRL LF (GAUZE/BANDAGES/DRESSINGS) ×3 IMPLANT
GLOVE BIOGEL PI IND STRL 9 (GLOVE) ×1 IMPLANT
GLOVE BIOGEL PI INDICATOR 9 (GLOVE) ×2
GLOVE SURG ORTHO 9.0 STRL STRW (GLOVE) ×3 IMPLANT
GOWN STRL REUS W/ TWL XL LVL3 (GOWN DISPOSABLE) ×2 IMPLANT
GOWN STRL REUS W/TWL XL LVL3 (GOWN DISPOSABLE) ×4
KIT BASIN OR (CUSTOM PROCEDURE TRAY) ×3 IMPLANT
KIT TURNOVER KIT B (KITS) ×3 IMPLANT
MANIFOLD NEPTUNE II (INSTRUMENTS) ×3 IMPLANT
NEEDLE SPNL 18GX3.5 QUINCKE PK (NEEDLE) ×3 IMPLANT
NS IRRIG 1000ML POUR BTL (IV SOLUTION) ×3 IMPLANT
PACK SHOULDER (CUSTOM PROCEDURE TRAY) ×3 IMPLANT
PAD ABD 8X10 STRL (GAUZE/BANDAGES/DRESSINGS) ×3 IMPLANT
PAD ARMBOARD 7.5X6 YLW CONV (MISCELLANEOUS) ×6 IMPLANT
SET ARTHROSCOPY TUBING (MISCELLANEOUS) ×2
SET ARTHROSCOPY TUBING LN (MISCELLANEOUS) ×1 IMPLANT
SLING ARM FOAM STRAP MED (SOFTGOODS) ×3 IMPLANT
SLING ARM IMMOBILIZER XL (CAST SUPPLIES) ×3 IMPLANT
SUT ETHILON 2 0 FS 18 (SUTURE) ×3 IMPLANT
TAPE CLOTH SURG 6X10 WHT LF (GAUZE/BANDAGES/DRESSINGS) ×3 IMPLANT
TOWEL OR 17X24 6PK STRL BLUE (TOWEL DISPOSABLE) ×6 IMPLANT
TUBING ARTHRO INFLOW-ONLY STRL (TUBING) ×3 IMPLANT
WAND STAR VAC 90 (SURGICAL WAND) ×3 IMPLANT
WATER STERILE IRR 1000ML POUR (IV SOLUTION) ×3 IMPLANT

## 2018-12-13 NOTE — Anesthesia Procedure Notes (Signed)
Procedure Name: Intubation Date/Time: 12/13/2018 7:36 AM Performed by: Gwyndolyn Saxon, CRNA Pre-anesthesia Checklist: Patient identified, Emergency Drugs available, Suction available, Patient being monitored and Timeout performed Patient Re-evaluated:Patient Re-evaluated prior to induction Oxygen Delivery Method: Circle system utilized Preoxygenation: Pre-oxygenation with 100% oxygen Induction Type: IV induction Ventilation: Mask ventilation without difficulty Laryngoscope Size: Miller and 2 Grade View: Grade I Tube type: Oral Tube size: 7.0 mm Number of attempts: 1 Placement Confirmation: ETT inserted through vocal cords under direct vision,  positive ETCO2 and breath sounds checked- equal and bilateral Tube secured with: Tape Dental Injury: Teeth and Oropharynx as per pre-operative assessment

## 2018-12-13 NOTE — Op Note (Signed)
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 female with a diagnosis of Impingement Left Shoulder who failed conservative measures and elected for surgical management.    The risks benefits and alternatives were discussed with the patient preoperatively including but not limited to the risks of infection, bleeding, nerve injury, cardiopulmonary complications, the need for revision surgery, among others, and the patient was willing to proceed.  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  After adequate levels anesthesia were obtained patient was placed in the beachchair position the left upper extremity was prepped using DuraPrep draped into a sterile field a timeout was called.  The scope was inserted from the posterior portal into the subacromial space and lateral portal was established.  Patient had a hook type III acromion and subacromial scar tissue.  Patient underwent subacromial decompression and acromioplasty.  Electrical wand was used for hemostasis.  Patient had an intact rotator cuff.  The scope was then removed the scope was inserted into the glenohumeral joint from the posterior portal and an anterior portal was established with outside in technique with an 18-gauge spinal.  Visualization showed the glenohumeral joint completely adhered with scar tissue.  Using the shaver and the vapor wand the scar tissue was  removed from the glenohumeral joint.  There is good articular cartilage on the glenoid.  Patient had improved range of motion from abduction of 70 degrees abduction 120 degrees.  After debridement the instruments were removed the portals were closed using 2-0 nylon a sterile dressing was applied patient was extubated taken the PACU in stable condition.   DISCHARGE PLANNING:  Antibiotic duration: Preoperative antibiotics  Weightbearing: Not applicable  Pain medication: Prescription for Vicodin  Dressing care/ Wound VAC: Change dressing in 2 days  Ambulatory devices: Not applicable  Discharge to: Home.  Follow-up: In the office 1 week post operative.

## 2018-12-13 NOTE — Anesthesia Procedure Notes (Addendum)
Anesthesia Regional Block: Interscalene brachial plexus block   Pre-Anesthetic Checklist: ,, timeout performed, Correct Patient, Correct Site, Correct Laterality, Correct Procedure, Correct Position, site marked, Risks and benefits discussed,  Surgical consent,  Pre-op evaluation,  At surgeon's request and post-op pain management  Laterality: Left  Prep: chloraprep       Needles:  Injection technique: Single-shot  Needle Type: Echogenic Stimulator Needle     Needle Length: 5cm  Needle Gauge: 22     Additional Needles:   Procedures:, nerve stimulator,,, ultrasound used (permanent image in chart),,,,   Nerve Stimulator or Paresthesia:  Response: hand, 0.45 mA,   Additional Responses:   Narrative:  Start time: 12/13/2018 7:00 AM End time: 12/13/2018 7:11 AM Injection made incrementally with aspirations every 5 mL.  Performed by: Personally  Anesthesiologist: Janeece Riggers, MD  Additional Notes: Functioning IV was confirmed and monitors were applied.  A 26mm 22ga Arrow echogenic stimulator needle was used. Sterile prep and drape,hand hygiene and sterile gloves were used. Ultrasound guidance: relevant anatomy identified, needle position confirmed, local anesthetic spread visualized around nerve(s)., vascular puncture avoided.  Image printed for medical record. Negative aspiration and negative test dose prior to incremental administration of local anesthetic. The patient tolerated the procedure well.

## 2018-12-13 NOTE — H&P (Signed)
Rita Newton is an 63 y.o. female.   Chief Complaint: Pain and decreased range of motion left shoulder. HPI: Patient is a 63 year old woman whose had a remote nondisplaced proximal humerus fracture on the left.  Patient complains of pain from the neck of the shoulder radiating down her arm.  Previously her symptoms seem more radicular in nature and patient is status post an MRI scan.  Patient states that this time she has lost range of motion of her shoulder she tries to elevate it using her other hand without relief.  She states the previous subacromial injection provided her no relief she states the prednisone has provided very minimal relief.  Past Medical History:  Diagnosis Date  . Chronic pain   . Depression   . Hypertension    12/12/2018- no longer a problem  . Migraine   . Panic attacks    Sometimes- does not tqake medications  . Pneumonia    2017  . Sciatica     Past Surgical History:  Procedure Laterality Date  . ABDOMINAL HYSTERECTOMY    . BACK SURGERY     HNP- Disectomy  . BLADDER REPAIR      History reviewed. No pertinent family history. Social History:  reports that she has been smoking cigarettes. She has a 5.00 pack-year smoking history. She has never used smokeless tobacco. She reports that she does not drink alcohol or use drugs.  Allergies: No Known Allergies  Medications Prior to Admission  Medication Sig Dispense Refill  . ibuprofen (ADVIL,MOTRIN) 200 MG tablet Take 600 mg by mouth 4 (four) times daily as needed for moderate pain.    . traMADol (ULTRAM) 50 MG tablet Take 1 tablet (50 mg total) by mouth every 6 (six) hours as needed for moderate pain. (Patient taking differently: Take 50 mg by mouth every 4 (four) hours as needed for moderate pain. ) 30 tablet 0  . predniSONE (DELTASONE) 10 MG tablet TAKE 2 TABLETS(20 MG) BY MOUTH DAILY WITH BREAKFAST (Patient not taking: Reported on 12/09/2018) 60 tablet 0    Results for orders placed or performed during  the hospital encounter of 12/13/18 (from the past 48 hour(s))  CBC     Status: None   Collection Time: 12/13/18  6:28 AM  Result Value Ref Range   WBC 4.2 4.0 - 10.5 K/uL   RBC 4.19 3.87 - 5.11 MIL/uL   Hemoglobin 12.7 12.0 - 15.0 g/dL   HCT 38.4 36.0 - 46.0 %   MCV 91.6 80.0 - 100.0 fL   MCH 30.3 26.0 - 34.0 pg   MCHC 33.1 30.0 - 36.0 g/dL   RDW 12.7 11.5 - 15.5 %   Platelets 270 150 - 400 K/uL   nRBC 0.0 0.0 - 0.2 %    Comment: Performed at [facility], [place] [address] [place], [place] [address]   No results found.  Review of Systems  All other systems reviewed and are negative.   Blood pressure (!) 163/97, pulse 72, temperature 98.2 F (36.8 C), resp. rate 20, SpO2 97 %. Physical Exam  Patient is alert, oriented, no adenopathy, well-dressed, normal affect, normal respiratory effort. Examination patient only has abduction and flexion of left shoulder at this time of 70 degrees.  She has pain with Neer and Hawkins impingement test has 45 degrees of internal and external rotation.  The thoracic outlet is minimally tender to palpation of the left.  She has good motor strength in all motor groups of both  upper extremities and this is symmetric bilaterally.  Review of the MRI scan shows no disc pathology in the cervical spine.  Patient states she feels better with being in a reclining chair which consistent with the impingement of the left shoulder.  Patient has to manually lift her left arm to get it overhead but there is no motor weakness.  Patient has pain with Neer and Hawkins impingement test pain with a drop arm test. Assessment/Plan 1. Chronic left shoulder pain   2. Adhesive capsulitis of left shoulder     Plan: Discussed with the patient with her loss of range of motion of the left shoulder she does have adhesive capsulitis at this time.  With her significant symptoms feel the best option would be to proceed with arthroscopic debridement of the impingement with the  adhesive capsulitis.  Discussed the importance of continuing with smoking cessation.  Discussed risks and benefits including persistent pain need for additional surgery.    Rita Minion, MD 12/13/2018, 6:47 AM

## 2018-12-13 NOTE — Transfer of Care (Signed)
Immediate Anesthesia Transfer of Care Note  Patient: Rita Newton  Procedure(s) Performed: LEFT SHOULDER ARTHROSCOPY, DEBRIDEMENT, AND DECOMPRESSION (Left )  Patient Location: PACU  Anesthesia Type:General  Level of Consciousness: awake, alert  and oriented  Airway & Oxygen Therapy: Patient Spontanous Breathing and Patient connected to nasal cannula oxygen  Post-op Assessment: Report given to RN and Post -op Vital signs reviewed and stable  Post vital signs: Reviewed and stable  Last Vitals:  Vitals Value Taken Time  BP    Temp    Pulse    Resp 12 12/13/2018  8:37 AM  SpO2    Vitals shown include unvalidated device data.  Last Pain:  Vitals:   12/13/18 0610  PainSc: 8       Patients Stated Pain Goal: 8 (95/70/22 0266)  Complications: No apparent anesthesia complications

## 2018-12-13 NOTE — Anesthesia Postprocedure Evaluation (Signed)
Anesthesia Post Note  Patient: Rita Newton  Procedure(s) Performed: LEFT SHOULDER ARTHROSCOPY, DEBRIDEMENT, AND DECOMPRESSION (Left )     Patient location during evaluation: PACU Anesthesia Type: General Level of consciousness: awake and alert Pain management: pain level controlled Vital Signs Assessment: post-procedure vital signs reviewed and stable Respiratory status: spontaneous breathing, nonlabored ventilation, respiratory function stable and patient connected to nasal cannula oxygen Cardiovascular status: blood pressure returned to baseline and stable Postop Assessment: no apparent nausea or vomiting Anesthetic complications: no    Last Vitals:  Vitals:   12/13/18 0919 12/13/18 0925  BP: 106/68 (!) 112/59  Pulse: 68 62  Resp: 16 15  Temp: (!) 36.3 C   SpO2: 94% 92%    Last Pain:  Vitals:   12/13/18 0925  PainSc: 4                  Dalayna Lauter

## 2018-12-14 ENCOUNTER — Encounter (HOSPITAL_COMMUNITY): Payer: Self-pay | Admitting: Orthopedic Surgery

## 2018-12-18 ENCOUNTER — Ambulatory Visit (INDEPENDENT_AMBULATORY_CARE_PROVIDER_SITE_OTHER): Payer: Medicaid Other | Admitting: Orthopedic Surgery

## 2018-12-18 NOTE — Progress Notes (Signed)
Surgery was last Friday 12/13/2018.  Per pre-admissions : Rita Newton denies chest pain or shortness of breath.  Patient has a history of HTN, but is no longer on blod pressure medications. Records from her PCP were requested prior to proceeding with scheduled surgery.

## 2018-12-19 NOTE — Progress Notes (Signed)
Lm on vm foe pt to advise of this message below. Will hold message until pt confirms message received or to follow up at appt on 12/25/2018

## 2018-12-23 ENCOUNTER — Other Ambulatory Visit (INDEPENDENT_AMBULATORY_CARE_PROVIDER_SITE_OTHER): Payer: Self-pay | Admitting: Orthopedic Surgery

## 2018-12-23 NOTE — Telephone Encounter (Signed)
Pt is s/p a left shoulder scope 12/13/2018 and received rx for Vicodin is requesting refill on tramadol. Please advise.

## 2018-12-25 ENCOUNTER — Encounter (INDEPENDENT_AMBULATORY_CARE_PROVIDER_SITE_OTHER): Payer: Self-pay | Admitting: Family

## 2018-12-25 ENCOUNTER — Ambulatory Visit (INDEPENDENT_AMBULATORY_CARE_PROVIDER_SITE_OTHER): Payer: Medicaid Other | Admitting: Family

## 2018-12-25 ENCOUNTER — Telehealth (INDEPENDENT_AMBULATORY_CARE_PROVIDER_SITE_OTHER): Payer: Self-pay | Admitting: Orthopedic Surgery

## 2018-12-25 DIAGNOSIS — M546 Pain in thoracic spine: Secondary | ICD-10-CM

## 2018-12-25 DIAGNOSIS — S46012D Strain of muscle(s) and tendon(s) of the rotator cuff of left shoulder, subsequent encounter: Secondary | ICD-10-CM

## 2018-12-25 MED ORDER — PREDNISONE 50 MG PO TABS: ORAL_TABLET | ORAL | 0 refills | Status: DC

## 2018-12-25 NOTE — Progress Notes (Signed)
Office Visit Note   Patient: Rita Newton           Date of Birth: 1956-05-11           MRN: [id number] Visit Date: 12/25/2018              Requested by: No referring provider defined for this encounter. PCP: Patient, No Pcp Per  Chief Complaint  Patient presents with  . Left Shoulder - Routine Post Op      HPI: The patient is a 63 year old woman who presents today in follow-up of status post left shoulder arthroscopy and debridement on January 24.  She has been working aggressively on range of motion.  Already pleased with her improvement in range of motion and reduction in pain since surgery.  However today she is also concerned for acute on chronic mid back pain this is been ongoing off and on for weeks to months.  Predominantly left-sided there is some associated shooting pain and some numbness down to her bilateral legs.  Denies any weakness no loss of control of bowel or bladder.  No imaging in the current system of her spine found.  However she does not have time today to wait to have thoracic spine films.  We will do this at her next visit.  Assessment & Plan: Visit Diagnoses:  1. Traumatic incomplete tear of left rotator cuff, subsequent encounter   2. Acute left-sided thoracic back pain     Plan: Encouraged her to continue with her home exercise program for her left shoulder.  Discussed range of motion and strengthening.  Sutures harvested today.  We will trial a short prednisone course for her radicular back pain she will follow-up in the office in 3 to 4 weeks we will image her thoracic spine if she is having continued pain.  Follow-Up Instructions: Return in about 4 weeks (around 01/22/2019).   Back Exam   Tenderness  The patient is experiencing tenderness in the thoracic (Some paraspinal tenderness noticed on the left).  Range of Motion  The patient has normal back ROM.  Muscle Strength  The patient has normal back strength.  Tests  Straight leg raise  right: negative Straight leg raise left: negative      Patient is alert, oriented, no adenopathy, well-dressed, normal affect, normal respiratory effort. Portals are clean and dry.  Sutures harvested.  No sign of infection.  Imaging: No results found. No images are attached to the encounter.  Labs: No results found for: HGBA1C, ESRSEDRATE, CRP, LABURIC, REPTSTATUS, GRAMSTAIN, CULT, LABORGA   Lab Results  Component Value Date   ALBUMIN 3.8 08/13/2013   ALBUMIN 4.2 08/30/2010   ALBUMIN 3.2 (L) 02/21/2010    There is no height or weight on file to calculate BMI.  Orders:  No orders of the defined types were placed in this encounter.  Meds ordered this encounter  Medications  . predniSONE (DELTASONE) 50 MG tablet    Sig: Take one tablet daily by mouth    Dispense:  5 tablet    Refill:  0     Procedures: No procedures performed  Clinical Data: No additional findings.  ROS:  All other systems negative, except as noted in the HPI. Review of Systems  Constitutional: Negative for chills and fever.  Musculoskeletal: Positive for arthralgias and back pain.  Neurological: Negative for weakness and numbness.    Objective: Vital Signs: There were no vitals taken for this visit.  Specialty Comments:  No specialty comments  available.  PMFS History: Patient Active Problem List   Diagnosis Date Noted  . Impingement syndrome of left shoulder   . Traumatic incomplete tear of left rotator cuff   . Chronic pain   . OBESITY 08/30/2010  . DEPRESSION 08/30/2010  . MENOPAUSE, SURGICAL 06/20/2010  . FIBROIDS, UTERUS 09/20/2009  . ABDOMINAL PAIN, RIGHT LOWER QUADRANT 09/14/2009  . IRRITABLE BOWEL SYNDROME 01/11/2009  . LAMINECTOMY, LUMBAR, HX OF 06/18/2008  . DENTAL CARIES 09/16/2007  . 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 SYNOVIAL CYST 08/19/2007  . Thoracic back pain 08/05/2007  . TOBACCO USER 08/02/2007  . PARESTHESIA 08/02/2007    Past Medical History:  Diagnosis Date  . Chronic pain   . Depression   . Hypertension    12/12/2018- no longer a problem  . Migraine   . Panic attacks    Sometimes- does not tqake medications  . Pneumonia    2017  . Sciatica     History reviewed. No pertinent family history.  Past Surgical History:  Procedure Laterality Date  . ABDOMINAL HYSTERECTOMY    . BACK SURGERY     HNP- Disectomy  . BLADDER REPAIR    . SHOULDER ARTHROSCOPY Left 12/13/2018   Procedure: LEFT SHOULDER ARTHROSCOPY, DEBRIDEMENT, AND DECOMPRESSION;  Surgeon: Newt Minion, MD;  Location: [facility];  Service: Orthopedics;  Laterality: Left;   Social History   Occupational History  . Not on file  Tobacco Use  . Smoking status: Current Every Day Smoker    Packs/day: 0.25    Years: 20.00    Pack years: 5.00    Types: Cigarettes  . Smokeless tobacco: Never Used  Substance and Sexual Activity  . Alcohol use: No  . Drug use: No  . Sexual activity: Not on file

## 2018-12-25 NOTE — Telephone Encounter (Signed)
I called pt to advise that this medication has been sent to her pharm but then to also follow up with her to see if she had called her PCP to address the changes in her EKG that was noted during her pre op eval. Pt said that she did receive my message and that she would be calling to follow up with them today.

## 2018-12-25 NOTE — Telephone Encounter (Signed)
Prednisone  Send prescription to [facility] [phone] per patient informed me at checkout

## 2018-12-30 ENCOUNTER — Other Ambulatory Visit (INDEPENDENT_AMBULATORY_CARE_PROVIDER_SITE_OTHER): Payer: Self-pay | Admitting: Physician Assistant

## 2018-12-30 ENCOUNTER — Other Ambulatory Visit (INDEPENDENT_AMBULATORY_CARE_PROVIDER_SITE_OTHER): Payer: Self-pay | Admitting: Family

## 2018-12-30 ENCOUNTER — Telehealth (INDEPENDENT_AMBULATORY_CARE_PROVIDER_SITE_OTHER): Payer: Self-pay | Admitting: Orthopedic Surgery

## 2018-12-30 ENCOUNTER — Other Ambulatory Visit (INDEPENDENT_AMBULATORY_CARE_PROVIDER_SITE_OTHER): Payer: Self-pay | Admitting: Orthopedic Surgery

## 2018-12-30 MED ORDER — PREDNISONE 50 MG PO TABS: ORAL_TABLET | ORAL | 0 refills | Status: DC

## 2018-12-30 MED ORDER — TRAMADOL HCL 50 MG PO TABS: 50.0000 mg | ORAL_TABLET | Freq: Four times a day (QID) | ORAL | 0 refills | Status: DC | PRN

## 2018-12-30 NOTE — Telephone Encounter (Signed)
Please advise. Tramadol filled 12/23/2018 Qty #30 for 7 days. Im not sure if you want to refill Prednisone. Thanks

## 2018-12-30 NOTE — Telephone Encounter (Signed)
Refilled tramadol and prednisone for another week.

## 2018-12-30 NOTE — Telephone Encounter (Signed)
Thank you :)

## 2018-12-30 NOTE — Telephone Encounter (Signed)
Pt called in said she needs a refill on her medication Prednisone and Tramadol, she tried to refill them with the pharmacy but she was told it was denied and she's wanting to know why if she had surgery on the 24th of January and her back and under her arms are hurting badly.  [phone]

## 2019-01-06 ENCOUNTER — Other Ambulatory Visit (INDEPENDENT_AMBULATORY_CARE_PROVIDER_SITE_OTHER): Payer: Self-pay | Admitting: Physician Assistant

## 2019-01-07 ENCOUNTER — Other Ambulatory Visit (INDEPENDENT_AMBULATORY_CARE_PROVIDER_SITE_OTHER): Payer: Self-pay | Admitting: Physician Assistant

## 2019-01-07 ENCOUNTER — Telehealth (INDEPENDENT_AMBULATORY_CARE_PROVIDER_SITE_OTHER): Payer: Self-pay | Admitting: Orthopedic Surgery

## 2019-01-07 MED ORDER — TRAMADOL HCL 50 MG PO TABS: 50.0000 mg | ORAL_TABLET | Freq: Four times a day (QID) | ORAL | 0 refills | Status: DC | PRN

## 2019-01-07 MED ORDER — PREDNISONE 50 MG PO TABS: ORAL_TABLET | ORAL | 0 refills | Status: DC

## 2019-01-07 NOTE — Telephone Encounter (Signed)
Please advise, Pt last Tramadol filled 2/10 Qty #30 for 7 days

## 2019-01-07 NOTE — Telephone Encounter (Signed)
Orders sent as requested 

## 2019-01-07 NOTE — Telephone Encounter (Signed)
Ms. Rita Newton states that her pharmacy sent in requests for refills of prednisone and tramadol.  She would like to know if these refills will be sent in today.  States that she is in a lot of pain.  She has a follow up appointment scheduled for next Wednesday.

## 2019-01-14 ENCOUNTER — Other Ambulatory Visit (INDEPENDENT_AMBULATORY_CARE_PROVIDER_SITE_OTHER): Payer: Self-pay | Admitting: Orthopedic Surgery

## 2019-01-14 NOTE — Telephone Encounter (Signed)
Too soon to refill.

## 2019-01-14 NOTE — Telephone Encounter (Signed)
Pt just had rx refilled on 01/07/2019

## 2019-01-15 ENCOUNTER — Ambulatory Visit (INDEPENDENT_AMBULATORY_CARE_PROVIDER_SITE_OTHER): Payer: Medicaid Other | Admitting: Family

## 2019-01-15 ENCOUNTER — Encounter (INDEPENDENT_AMBULATORY_CARE_PROVIDER_SITE_OTHER): Payer: Self-pay | Admitting: Family

## 2019-01-15 ENCOUNTER — Ambulatory Visit (INDEPENDENT_AMBULATORY_CARE_PROVIDER_SITE_OTHER): Payer: Self-pay

## 2019-01-15 VITALS — Ht 64.0 in | Wt 159.0 lb

## 2019-01-15 DIAGNOSIS — M542 Cervicalgia: Secondary | ICD-10-CM | POA: Diagnosis not present

## 2019-01-15 DIAGNOSIS — M546 Pain in thoracic spine: Secondary | ICD-10-CM

## 2019-01-15 MED ORDER — METHOCARBAMOL 500 MG PO TABS: 500.0000 mg | ORAL_TABLET | Freq: Four times a day (QID) | ORAL | 0 refills | Status: DC | PRN

## 2019-01-15 MED ORDER — HYDROCODONE-ACETAMINOPHEN 5-325 MG PO TABS: 1.0000 | ORAL_TABLET | Freq: Four times a day (QID) | ORAL | 0 refills | Status: DC | PRN

## 2019-01-15 MED ORDER — PREDNISONE 10 MG PO TABS: 10.0000 mg | ORAL_TABLET | Freq: Every day | ORAL | 0 refills | Status: DC

## 2019-01-15 NOTE — Progress Notes (Signed)
Office Visit Note   Patient: Rita Newton           Date of Birth: Nov 14, 1956           MRN: [id number] Visit Date: 01/15/2019              Requested by: No referring provider defined for this encounter. PCP: Patient, No Pcp Per  Chief Complaint  Patient presents with  . Left Shoulder - Routine Post Op    12/13/2018 [facility] & Decompression surg      HPI: The patient is a 63 year old woman who presents today in follow-up of status post left shoulder arthroscopy and debridement on January 24.  She has been working aggressively on range of motion.  Continues to be pleased with her improvement in range of motion and reduction in pain since surgery.    Today her primary concern is for acute on chronic left sided neck and upper back pain this is been ongoing off and on for months. Today is tearful. States cannot care for her granddaughter due to pain. Did have a course of prednisone a few weeks ago which eased her symptoms. Symptoms returned once stopped pred.   Left sided neck pain, associated with shooting burning pains down posterior shoulder and upper pack. Numbness and tingling to 4th and 5th toes. No weakness. No history of dropping things. Worse with forward flexion of neck.   T spine films today.  Assessment & Plan: Visit Diagnoses:  1. Acute left-sided thoracic back pain   2. Cervicalgia     Plan: Encouraged her to continue with her home exercise program for her left shoulder.  Discussed range of motion and strengthening.  Will refer to neurosurgery for evaluation. Patient to inform of her choice of surgeon for referral. Will order pred 20 mg until gets in with neurosurgery. Deferred referral for esi at this time.  Follow-Up Instructions: Return if symptoms worsen or fail to improve.   Back Exam   Tenderness  The patient is experiencing tenderness in the cervical (Some paraspinal tenderness noticed on the left).  Range of Motion  The patient has normal back  ROM.  Muscle Strength  The patient has normal back strength.  Tests  Straight leg raise right: negative Straight leg raise left: negative  Comments:  Worsening of symptoms with forward flexion neck. Negative spurlings   Right Hand Exam   Muscle Strength  Grip: 4/5   Other  Sensation: normal   Left Hand Exam   Muscle Strength  Grip:  4/5   Other  Sensation: normal      Patient is alert, oriented, no adenopathy, well-dressed, normal affect, normal respiratory effort. Portals are well healed. Full rom shoulder.  Imaging: No results found. No images are attached to the encounter.  Labs: No results found for: HGBA1C, ESRSEDRATE, CRP, LABURIC, REPTSTATUS, GRAMSTAIN, CULT, LABORGA   Lab Results  Component Value Date   ALBUMIN 3.8 08/13/2013   ALBUMIN 4.2 08/30/2010   ALBUMIN 3.2 (L) 02/21/2010    Body mass index is 27.29 kg/m.  Orders:  Orders Placed This Encounter  Procedures  . XR Thoracic Spine 2 View  . Ambulatory referral to Neurosurgery   Meds ordered this encounter  Medications  . HYDROcodone-acetaminophen (NORCO/VICODIN) 5-325 MG tablet    Sig: Take 1 tablet by mouth every 6 (six) hours as needed for moderate pain.    Dispense:  20 tablet    Refill:  0  . predniSONE (DELTASONE) 10 MG  tablet    Sig: Take 1 tablet (10 mg total) by mouth daily. Take 2 tablets with breakfast until symptoms subside. Then take 1 tablet daily    Dispense:  30 tablet    Refill:  0  . methocarbamol (ROBAXIN) 500 MG tablet    Sig: Take 1 tablet (500 mg total) by mouth every 6 (six) hours as needed for muscle spasms.    Dispense:  30 tablet    Refill:  0     Procedures: No procedures performed  Clinical Data: No additional findings.  ROS:  All other systems negative, except as noted in the HPI. Review of Systems  Constitutional: Negative for chills and fever.  Musculoskeletal: Positive for arthralgias and back pain.  Neurological: Negative for weakness and  numbness.    Objective: Vital Signs: Ht 5\' 4"  (1.626 m)   Wt 159 lb (72.1 kg)   BMI 27.29 kg/m   Specialty Comments:  No specialty comments available.  PMFS History: Patient Active Problem List   Diagnosis Date Noted  . Impingement syndrome of left shoulder   . Traumatic incomplete tear of left rotator cuff   . Chronic pain   . OBESITY 08/30/2010  . DEPRESSION 08/30/2010  . MENOPAUSE, SURGICAL 06/20/2010  . FIBROIDS, UTERUS 09/20/2009  . ABDOMINAL PAIN, RIGHT LOWER QUADRANT 09/14/2009  . IRRITABLE BOWEL SYNDROME 01/11/2009  . LAMINECTOMY, LUMBAR, HX OF 06/18/2008  . DENTAL CARIES 09/16/2007  . 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 SYNOVIAL CYST 08/19/2007  . Thoracic back pain 08/05/2007  . TOBACCO USER 08/02/2007  . PARESTHESIA 08/02/2007   Past Medical History:  Diagnosis Date  . Chronic pain   . Depression   . Hypertension    12/12/2018- no longer a problem  . Migraine   . Panic attacks    Sometimes- does not tqake medications  . Pneumonia    2017  . Sciatica     History reviewed. No pertinent family history.  Past Surgical History:  Procedure Laterality Date  . ABDOMINAL HYSTERECTOMY    . BACK SURGERY     HNP- Disectomy  . BLADDER REPAIR    . SHOULDER ARTHROSCOPY Left 12/13/2018   Procedure: LEFT SHOULDER ARTHROSCOPY, DEBRIDEMENT, AND DECOMPRESSION;  Surgeon: Newt Minion, MD;  Location: [facility];  Service: Orthopedics;  Laterality: Left;   Social History   Occupational History  . Not on file  Tobacco Use  . Smoking status: Current Every Day Smoker    Packs/day: 0.25    Years: 20.00    Pack years: 5.00    Types: Cigarettes  . Smokeless tobacco: Never Used  Substance and Sexual Activity  . Alcohol use: No  . Drug use: No  . Sexual activity: Not on file

## 2019-01-20 ENCOUNTER — Other Ambulatory Visit (INDEPENDENT_AMBULATORY_CARE_PROVIDER_SITE_OTHER): Payer: Self-pay | Admitting: Physician Assistant

## 2019-01-20 ENCOUNTER — Telehealth (INDEPENDENT_AMBULATORY_CARE_PROVIDER_SITE_OTHER): Payer: Self-pay | Admitting: Family

## 2019-01-20 DIAGNOSIS — M542 Cervicalgia: Secondary | ICD-10-CM

## 2019-01-20 MED ORDER — TRAMADOL HCL 50 MG PO TABS: 50.0000 mg | ORAL_TABLET | Freq: Four times a day (QID) | ORAL | 0 refills | Status: DC | PRN

## 2019-01-20 NOTE — Telephone Encounter (Signed)
Please advise, thank you. Qty 30 for 7 days refilled on 01/07/2019 for Tramadol  Hydrocodone-Acet 5-325 Qty #20 for 5 days filled 01/15/2019

## 2019-01-20 NOTE — Telephone Encounter (Signed)
12/13/2018 pt is s/p shoulder scope, debridement and decomp requesting refill on tramadol. Received Vicodin rx 01/15/2019 #20 and a total of 90 tramadol the month of February. Please advise.

## 2019-01-20 NOTE — Telephone Encounter (Signed)
Pt called back saying she got an apt for the 31. And in the mean time can she get some tramadol  Pt # [phone]

## 2019-01-20 NOTE — Telephone Encounter (Signed)
Can you please approve and send via thumb print scanner. I will call pt.

## 2019-01-20 NOTE — Telephone Encounter (Signed)
Will refill tramadol #20.

## 2019-01-20 NOTE — Telephone Encounter (Signed)
Ok refill, seems like most symptoms are in her neck now, recommend using heat on her neck, and we can set up PT if she wants

## 2019-01-20 NOTE — Telephone Encounter (Signed)
Patient called to let Junie Panning know that she is waiting for the doctor's office to give her a call to schedule her appointment.  Thank you.

## 2019-01-20 NOTE — Telephone Encounter (Signed)
Noted  

## 2019-01-27 ENCOUNTER — Other Ambulatory Visit (INDEPENDENT_AMBULATORY_CARE_PROVIDER_SITE_OTHER): Payer: Self-pay | Admitting: Family

## 2019-02-03 ENCOUNTER — Other Ambulatory Visit (INDEPENDENT_AMBULATORY_CARE_PROVIDER_SITE_OTHER): Payer: Self-pay | Admitting: Orthopedic Surgery

## 2019-02-04 ENCOUNTER — Telehealth (INDEPENDENT_AMBULATORY_CARE_PROVIDER_SITE_OTHER): Payer: Self-pay | Admitting: Orthopedic Surgery

## 2019-02-04 ENCOUNTER — Other Ambulatory Visit (INDEPENDENT_AMBULATORY_CARE_PROVIDER_SITE_OTHER): Payer: Self-pay | Admitting: Orthopedic Surgery

## 2019-02-04 MED ORDER — TRAMADOL HCL 50 MG PO TABS: 50.0000 mg | ORAL_TABLET | Freq: Four times a day (QID) | ORAL | 0 refills | Status: DC | PRN

## 2019-02-04 NOTE — Telephone Encounter (Signed)
Patient is requesting refill on tramadol she has received #50 in the past 2 weeks. Taking 4 times a day every day. Being referred to Dr. Arneta Cliche 02/18/19. Do you wish to refill before appt?

## 2019-02-04 NOTE — Telephone Encounter (Signed)
I called and advised pt of rx. To call with questions.

## 2019-02-04 NOTE — Telephone Encounter (Signed)
rx sent

## 2019-02-04 NOTE — Telephone Encounter (Signed)
Pt called asking for a refill on tramadol.  Pt requested it to be sent to [facility] on [address]

## 2019-02-07 NOTE — Addendum Note (Signed)
Addendum  created 02/07/19 1946 by Janeece Riggers, MD   Clinical Note Signed, Intraprocedure Blocks edited

## 2019-02-26 ENCOUNTER — Other Ambulatory Visit: Payer: Self-pay | Admitting: Neurosurgery

## 2019-02-26 DIAGNOSIS — M546 Pain in thoracic spine: Secondary | ICD-10-CM

## 2019-04-07 ENCOUNTER — Other Ambulatory Visit: Payer: Self-pay

## 2019-04-07 ENCOUNTER — Ambulatory Visit
Admission: RE | Admit: 2019-04-07 | Discharge: 2019-04-07 | Disposition: A | Discharge status: Home / Self Care | Payer: Medicaid Other | Source: Ambulatory Visit | Attending: Neurosurgery | Admitting: Neurosurgery

## 2019-04-07 DIAGNOSIS — M546 Pain in thoracic spine: Secondary | ICD-10-CM

## 2019-09-19 ENCOUNTER — Ambulatory Visit (INDEPENDENT_AMBULATORY_CARE_PROVIDER_SITE_OTHER): Payer: Medicaid Other | Admitting: Cardiology

## 2019-09-19 ENCOUNTER — Encounter: Payer: Self-pay | Admitting: Cardiology

## 2019-09-19 ENCOUNTER — Other Ambulatory Visit: Payer: Self-pay

## 2019-09-19 VITALS — BP 131/86 | HR 82 | Temp 98.4°F | Ht 64.0 in | Wt 134.0 lb

## 2019-09-19 DIAGNOSIS — Z01810 Encounter for preprocedural cardiovascular examination: Secondary | ICD-10-CM

## 2019-09-19 DIAGNOSIS — Z720 Tobacco use: Secondary | ICD-10-CM

## 2019-09-19 NOTE — Progress Notes (Signed)
Patient referred by Earlie Server, MD for pre-op evaluation  Subjective:   Rita Newton, female    DOB: [birth year], 63 y.o.   MRN: [id number]   Chief Complaint  Patient presents with  . Abnormal ECG  . Medical Clearance    HPI  63 y.o. Caucasian female with tobacco abuse, depression, referred for pre-op evaluation.  Patient is going to undergo right knee surgery by Dr. French Ana. Pre-op EKG showed PVC's. Patient stays active taking care of her grandchildren. She denies chest pain, shortness of breath, palpitations, leg edema, orthopnea, PND, TIA/syncope.   Past Medical History:  Diagnosis Date  . Chronic pain   . Depression   . Hypertension    12/12/2018- no longer a problem  . Migraine   . Panic attacks    Sometimes- does not tqake medications  . Pneumonia    2017  . Sciatica      Past Surgical History:  Procedure Laterality Date  . ABDOMINAL HYSTERECTOMY    . BACK SURGERY     HNP- Disectomy  . BLADDER REPAIR    . SHOULDER ARTHROSCOPY Left 12/13/2018   Procedure: LEFT SHOULDER ARTHROSCOPY, DEBRIDEMENT, AND DECOMPRESSION;  Surgeon: Newt Minion, MD;  Location: [place];  Service: Orthopedics;  Laterality: Left;     Social History   Socioeconomic History  . Marital status: Single    Spouse name: Not on file  . Number of children: 2  . Years of education: Not on file  . Highest education level: Not on file  Occupational History  . Not on file  Social Needs  . Financial resource strain: Not on file  . Food insecurity    Worry: Not on file    Inability: Not on file  . Transportation needs    Medical: Not on file    Non-medical: Not on file  Tobacco Use  . Smoking status: Current Every Day Smoker    Packs/day: 0.25    Years: 20.00    Pack years: 5.00    Types: Cigarettes  . Smokeless tobacco: Never Used  Substance and Sexual Activity  . Alcohol use: No  . Drug use: No  . Sexual activity: Not on file  Lifestyle  . Physical activity    Days  per week: Not on file    Minutes per session: Not on file  . Stress: Not on file  Relationships  . Social Herbalist on phone: Not on file    Gets together: Not on file    Attends religious service: Not on file    Active member of club or organization: Not on file    Attends meetings of clubs or organizations: Not on file    Relationship status: Not on file  . Intimate partner violence    Fear of current or ex partner: Not on file    Emotionally abused: Not on file    Physically abused: Not on file    Forced sexual activity: Not on file  Other Topics Concern  . Not on file  Social History Narrative  . Not on file     Family History  Problem Relation Age of Onset  . Hypertension Mother   . Diabetes Mother   . Hypertension Father   . Diabetes Brother      Current Outpatient Medications on File Prior to Visit  Medication Sig Dispense Refill  . methocarbamol (ROBAXIN) 500 MG tablet Take 1 tablet (500 mg total) by mouth every 6 (  six) hours as needed for muscle spasms. 30 tablet 0  . predniSONE (DELTASONE) 10 MG tablet Take 1 tablet (10 mg total) by mouth daily. Take 2 tablets with breakfast until symptoms subside. Then take 1 tablet daily 30 tablet 0  . traMADol (ULTRAM) 50 MG tablet Take 1 tablet (50 mg total) by mouth every 6 (six) hours as needed. 30 tablet 0   No current facility-administered medications on file prior to visit.     Cardiovascular studies:  EKG 09/19/2019: Sinus rhythm 74 bpm. Nonspecific T-abnormality.   Outside EKG 08/28/2019: Sinus rhythm 79 bpm.  2 PVCs seen.  Nonspecific ST-T changes.  Recent labs: 12/13/2018: Glucose 1 1.  BUN/creatinine 5/0.63.  eGFR normal.  Sodium 140, potassium 3.0. H/H 12.7/30.4.  MCV 91.6.  Platelets 270.   Review of Systems  Constitution: Negative for decreased appetite, malaise/fatigue, weight gain and weight loss.  HENT: Negative for congestion.   Eyes: Negative for visual disturbance.  Cardiovascular:  Negative for chest pain, dyspnea on exertion, leg swelling, palpitations and syncope.  Respiratory: Negative for cough.   Endocrine: Negative for cold intolerance.  Hematologic/Lymphatic: Does not bruise/bleed easily.  Skin: Negative for itching and rash.  Musculoskeletal: Negative for myalgias.  Gastrointestinal: Negative for abdominal pain, nausea and vomiting.  Genitourinary: Negative for dysuria.  Neurological: Negative for dizziness and weakness.  Psychiatric/Behavioral: The patient is not nervous/anxious.   All other systems reviewed and are negative.        Vitals:   09/19/19 1443  BP: 131/86  Pulse: 82  Temp: 98.4 F (36.9 C)  SpO2: 96%     Body mass index is 23 kg/m. Filed Weights   09/19/19 1443  Weight: 134 lb (60.8 kg)     Objective:   Physical Exam  Constitutional: She is oriented to person, place, and time. She appears well-developed and well-nourished. No distress.  HENT:  Head: Normocephalic and atraumatic.  Eyes: Pupils are equal, round, and reactive to light. Conjunctivae are normal.  Neck: No JVD present.  Cardiovascular: Normal rate, regular rhythm and intact distal pulses.  Pulmonary/Chest: Effort normal and breath sounds normal. She has no wheezes. She has no rales.  Abdominal: Soft. Bowel sounds are normal. There is no rebound.  Musculoskeletal:        General: No edema.  Lymphadenopathy:    She has no cervical adenopathy.  Neurological: She is alert and oriented to person, place, and time. No cranial nerve deficit.  Skin: Skin is warm and dry.  Psychiatric: She has a normal mood and affect.  Nursing note and vitals reviewed.         Assessment & Recommendations:   63 y.o. Caucasian female with tobacco abuse, depression, referred for pre-op evaluation.  Pre-op evaluation: Good functional capacity without angina., angina equivalent symptoms. Given PVC's on recent EKG, will check echocardiogram. If no significant abnormalities, may  proceed with surgery with low cardiac risk. Encouraged smoking cessation.    Thank you for referring the patient to [place]. Please feel free to contact with any questions.  Nigel Mormon, MD [facility] Pager: [phone] Office: [phone] If no answer Cell [phone]

## 2019-10-01 ENCOUNTER — Ambulatory Visit (INDEPENDENT_AMBULATORY_CARE_PROVIDER_SITE_OTHER): Payer: Medicaid Other

## 2019-10-01 ENCOUNTER — Other Ambulatory Visit: Payer: Self-pay

## 2019-10-01 DIAGNOSIS — Z01810 Encounter for preprocedural cardiovascular examination: Secondary | ICD-10-CM

## 2019-10-06 NOTE — Progress Notes (Signed)
Left detailed vm with results

## 2019-11-27 ENCOUNTER — Ambulatory Visit: Payer: Self-pay | Admitting: Physician Assistant

## 2019-11-27 NOTE — H&P (Signed)
TOTAL KNEE ADMISSION H&P  Patient is being admitted for right total knee arthroplasty.  Subjective:  Chief Complaint:right knee pain.  HPI: Rita Newton, 64 y.o. female, has a history of pain and functional disability in the right knee due to arthritis and has failed non-surgical conservative treatments for greater than 12 weeks to includeNSAID's and/or analgesics, corticosteriod injections and activity modification.  Onset of symptoms was gradual, starting 5 years ago with gradually worsening course since that time. The patient noted no past surgery on the right knee(s).  Patient currently rates pain in the right knee(s) at 10 out of 10 with activity. Patient has night pain, worsening of pain with activity and weight bearing, pain that interferes with activities of daily living, pain with passive range of motion, crepitus and joint swelling.  Patient has evidence of periarticular osteophytes and joint space narrowing by imaging studies.There is no active infection.  Patient Active Problem List   Diagnosis Date Noted  . Preop cardiovascular exam 09/19/2019  . Impingement syndrome of left shoulder   . Traumatic incomplete tear of left rotator cuff   . Chronic pain   . OBESITY 08/30/2010  . DEPRESSION 08/30/2010  . MENOPAUSE, SURGICAL 06/20/2010  . FIBROIDS, UTERUS 09/20/2009  . ABDOMINAL PAIN, RIGHT LOWER QUADRANT 09/14/2009  . IRRITABLE BOWEL SYNDROME 01/11/2009  . LAMINECTOMY, LUMBAR, HX OF 06/18/2008  . DENTAL CARIES 09/16/2007  . 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 SYNOVIAL CYST 08/19/2007  . Thoracic back pain 08/05/2007  . Tobacco abuse 08/02/2007  . PARESTHESIA 08/02/2007   Past Medical History:  Diagnosis Date  . Chronic pain   . Depression   . Hypertension    12/12/2018- no longer a problem  . Migraine   . Panic attacks    Sometimes- does not tqake medications  . Pneumonia    2017  . Sciatica     Past Surgical History:   Procedure Laterality Date  . ABDOMINAL HYSTERECTOMY    . BACK SURGERY     HNP- Disectomy  . BLADDER REPAIR    . SHOULDER ARTHROSCOPY Left 12/13/2018   Procedure: LEFT SHOULDER ARTHROSCOPY, DEBRIDEMENT, AND DECOMPRESSION;  Surgeon: Newt Minion, MD;  Location: [place];  Service: Orthopedics;  Laterality: Left;    Current Outpatient Medications  Medication Sig Dispense Refill Last Dose  . amLODipine (NORVASC) 10 MG tablet Take 10 mg by mouth daily.     . DULoxetine (CYMBALTA) 30 MG capsule Take 30 mg by mouth at bedtime.     . gabapentin (NEURONTIN) 600 MG tablet Take 600 mg by mouth 3 (three) times daily.     Marland Kitchen ibuprofen (ADVIL) 200 MG tablet Take 400 mg by mouth every 6 (six) hours as needed for moderate pain.     Marland Kitchen lisinopril (ZESTRIL) 10 MG tablet Take 10 mg by mouth daily.     . methocarbamol (ROBAXIN) 500 MG tablet Take 1 tablet (500 mg total) by mouth every 6 (six) hours as needed for muscle spasms. (Patient not taking: Reported on 11/24/2019) 30 tablet 0   . predniSONE (DELTASONE) 10 MG tablet Take 1 tablet (10 mg total) by mouth daily. Take 2 tablets with breakfast until symptoms subside. Then take 1 tablet daily (Patient not taking: Reported on 11/24/2019) 30 tablet 0   . traMADol (ULTRAM) 50 MG tablet Take 1 tablet (50 mg total) by mouth every 6 (six) hours as needed. (Patient taking differently: Take 50 mg by mouth every 4 (four) hours as needed  for moderate pain. ) 30 tablet 0    No current facility-administered medications for this visit.   No Known Allergies  Social History   Tobacco Use  . Smoking status: Current Every Day Smoker    Packs/day: 0.25    Years: 20.00    Pack years: 5.00    Types: Cigarettes  . Smokeless tobacco: Never Used  Substance Use Topics  . Alcohol use: No    Family History  Problem Relation Age of Onset  . Hypertension Mother   . Diabetes Mother   . Hypertension Father   . Diabetes Brother      Review of Systems  Musculoskeletal: Positive  for arthralgias and joint swelling.  Hematological: Bruises/bleeds easily.  All other systems reviewed and are negative.   Objective:  Physical Exam  Constitutional: She is oriented to person, place, and time. She appears well-developed and well-nourished. No distress.  HENT:  Head: Normocephalic and atraumatic.  Eyes: Pupils are equal, round, and reactive to light. Conjunctivae and EOM are normal.  Cardiovascular: Normal rate, regular rhythm, normal heart sounds and intact distal pulses.  Respiratory: Effort normal and breath sounds normal. No respiratory distress. She has no wheezes.  GI: Soft. Bowel sounds are normal. She exhibits no distension. There is no abdominal tenderness.  Musculoskeletal:     Cervical back: Normal range of motion and neck supple.     Right knee: Swelling and bony tenderness present. No erythema. Decreased range of motion. Tenderness present.  Lymphadenopathy:    She has no cervical adenopathy.  Neurological: She is alert and oriented to person, place, and time.  Skin: Skin is warm and dry. No rash noted. No erythema.  Psychiatric: She has a normal mood and affect. Her behavior is normal.    Vital signs in last 24 hours: @VSRANGES @  Labs:   Estimated body mass index is 23 kg/m as calculated from the following:   Height as of 09/19/19: 5\' 4"  (1.626 m).   Weight as of 09/19/19: 60.8 kg.   Imaging Review Plain radiographs demonstrate severe degenerative joint disease of the right knee(s). The overall alignment issignificant varus. The bone quality appears to be good for age and reported activity level.      Assessment/Plan:  End stage arthritis, right knee   The patient history, physical examination, clinical judgment of the provider and imaging studies are consistent with end stage degenerative joint disease of the right knee(s) and total knee arthroplasty is deemed medically necessary. The treatment options including medical management,  injection therapy arthroscopy and arthroplasty were discussed at length. The risks and benefits of total knee arthroplasty were presented and reviewed. The risks due to aseptic loosening, infection, stiffness, patella tracking problems, thromboembolic complications and other imponderables were discussed. The patient acknowledged the explanation, agreed to proceed with the plan and consent was signed. Patient is being admitted for inpatient treatment for surgery, pain control, PT, OT, prophylactic antibiotics, VTE prophylaxis, progressive ambulation and ADL's and discharge planning. The patient is planning to be discharged home with home health services    Anticipated LOS equal to or greater than 2 midnights due to - Age 60 and older with one or more of the following:  - Obesity  - Expected need for hospital services (PT, OT, Nursing) required for safe  discharge  - Anticipated need for postoperative skilled nursing care or inpatient rehab  - Active co-morbidities: None OR   - Unanticipated findings during/Post Surgery: None  - Patient is a high risk  of re-admission due to: None

## 2019-11-27 NOTE — H&P (View-Only) (Signed)
TOTAL KNEE ADMISSION H&P  Patient is being admitted for right total knee arthroplasty.  Subjective:  Chief Complaint:right knee pain.  HPI: Rita Newton, 64 y.o. female, has a history of pain and functional disability in the right knee due to arthritis and has failed non-surgical conservative treatments for greater than 12 weeks to includeNSAID's and/or analgesics, corticosteriod injections and activity modification.  Onset of symptoms was gradual, starting 5 years ago with gradually worsening course since that time. The patient noted no past surgery on the right knee(s).  Patient currently rates pain in the right knee(s) at 10 out of 10 with activity. Patient has night pain, worsening of pain with activity and weight bearing, pain that interferes with activities of daily living, pain with passive range of motion, crepitus and joint swelling.  Patient has evidence of periarticular osteophytes and joint space narrowing by imaging studies.There is no active infection.  Patient Active Problem List   Diagnosis Date Noted  . Preop cardiovascular exam 09/19/2019  . Impingement syndrome of left shoulder   . Traumatic incomplete tear of left rotator cuff   . Chronic pain   . OBESITY 08/30/2010  . DEPRESSION 08/30/2010  . MENOPAUSE, SURGICAL 06/20/2010  . FIBROIDS, UTERUS 09/20/2009  . ABDOMINAL PAIN, RIGHT LOWER QUADRANT 09/14/2009  . IRRITABLE BOWEL SYNDROME 01/11/2009  . LAMINECTOMY, LUMBAR, HX OF 06/18/2008  . DENTAL CARIES 09/16/2007  . 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 SYNOVIAL CYST 08/19/2007  . Thoracic back pain 08/05/2007  . Tobacco abuse 08/02/2007  . PARESTHESIA 08/02/2007   Past Medical History:  Diagnosis Date  . Chronic pain   . Depression   . Hypertension    12/12/2018- no longer a problem  . Migraine   . Panic attacks    Sometimes- does not tqake medications  . Pneumonia    2017  . Sciatica     Past Surgical History:   Procedure Laterality Date  . ABDOMINAL HYSTERECTOMY    . BACK SURGERY     HNP- Disectomy  . BLADDER REPAIR    . SHOULDER ARTHROSCOPY Left 12/13/2018   Procedure: LEFT SHOULDER ARTHROSCOPY, DEBRIDEMENT, AND DECOMPRESSION;  Surgeon: Newt Minion, MD;  Location: [place];  Service: Orthopedics;  Laterality: Left;    Current Outpatient Medications  Medication Sig Dispense Refill Last Dose  . amLODipine (NORVASC) 10 MG tablet Take 10 mg by mouth daily.     . DULoxetine (CYMBALTA) 30 MG capsule Take 30 mg by mouth at bedtime.     . gabapentin (NEURONTIN) 600 MG tablet Take 600 mg by mouth 3 (three) times daily.     Marland Kitchen ibuprofen (ADVIL) 200 MG tablet Take 400 mg by mouth every 6 (six) hours as needed for moderate pain.     Marland Kitchen lisinopril (ZESTRIL) 10 MG tablet Take 10 mg by mouth daily.     . methocarbamol (ROBAXIN) 500 MG tablet Take 1 tablet (500 mg total) by mouth every 6 (six) hours as needed for muscle spasms. (Patient not taking: Reported on 11/24/2019) 30 tablet 0   . predniSONE (DELTASONE) 10 MG tablet Take 1 tablet (10 mg total) by mouth daily. Take 2 tablets with breakfast until symptoms subside. Then take 1 tablet daily (Patient not taking: Reported on 11/24/2019) 30 tablet 0   . traMADol (ULTRAM) 50 MG tablet Take 1 tablet (50 mg total) by mouth every 6 (six) hours as needed. (Patient taking differently: Take 50 mg by mouth every 4 (four) hours as needed  for moderate pain. ) 30 tablet 0    No current facility-administered medications for this visit.   No Known Allergies  Social History   Tobacco Use  . Smoking status: Current Every Day Smoker    Packs/day: 0.25    Years: 20.00    Pack years: 5.00    Types: Cigarettes  . Smokeless tobacco: Never Used  Substance Use Topics  . Alcohol use: No    Family History  Problem Relation Age of Onset  . Hypertension Mother   . Diabetes Mother   . Hypertension Father   . Diabetes Brother      Review of Systems  Musculoskeletal: Positive  for arthralgias and joint swelling.  Hematological: Bruises/bleeds easily.  All other systems reviewed and are negative.   Objective:  Physical Exam  Constitutional: She is oriented to person, place, and time. She appears well-developed and well-nourished. No distress.  HENT:  Head: Normocephalic and atraumatic.  Eyes: Pupils are equal, round, and reactive to light. Conjunctivae and EOM are normal.  Cardiovascular: Normal rate, regular rhythm, normal heart sounds and intact distal pulses.  Respiratory: Effort normal and breath sounds normal. No respiratory distress. She has no wheezes.  GI: Soft. Bowel sounds are normal. She exhibits no distension. There is no abdominal tenderness.  Musculoskeletal:     Cervical back: Normal range of motion and neck supple.     Right knee: Swelling and bony tenderness present. No erythema. Decreased range of motion. Tenderness present.  Lymphadenopathy:    She has no cervical adenopathy.  Neurological: She is alert and oriented to person, place, and time.  Skin: Skin is warm and dry. No rash noted. No erythema.  Psychiatric: She has a normal mood and affect. Her behavior is normal.    Vital signs in last 24 hours: @VSRANGES @  Labs:   Estimated body mass index is 23 kg/m as calculated from the following:   Height as of 09/19/19: 5\' 4"  (1.626 m).   Weight as of 09/19/19: 60.8 kg.   Imaging Review Plain radiographs demonstrate severe degenerative joint disease of the right knee(s). The overall alignment issignificant varus. The bone quality appears to be good for age and reported activity level.      Assessment/Plan:  End stage arthritis, right knee   The patient history, physical examination, clinical judgment of the provider and imaging studies are consistent with end stage degenerative joint disease of the right knee(s) and total knee arthroplasty is deemed medically necessary. The treatment options including medical management,  injection therapy arthroscopy and arthroplasty were discussed at length. The risks and benefits of total knee arthroplasty were presented and reviewed. The risks due to aseptic loosening, infection, stiffness, patella tracking problems, thromboembolic complications and other imponderables were discussed. The patient acknowledged the explanation, agreed to proceed with the plan and consent was signed. Patient is being admitted for inpatient treatment for surgery, pain control, PT, OT, prophylactic antibiotics, VTE prophylaxis, progressive ambulation and ADL's and discharge planning. The patient is planning to be discharged home with home health services    Anticipated LOS equal to or greater than 2 midnights due to - Age 22 and older with one or more of the following:  - Obesity  - Expected need for hospital services (PT, OT, Nursing) required for safe  discharge  - Anticipated need for postoperative skilled nursing care or inpatient rehab  - Active co-morbidities: None OR   - Unanticipated findings during/Post Surgery: None  - Patient is a high risk  of re-admission due to: None

## 2019-11-28 NOTE — Progress Notes (Signed)
[facility] [id number], [place] [address] [place] [address] Phone: [phone] Fax: [phone]      Your procedure is scheduled on Friday, January 22nd.  Report to [facility] Main Entrance "A" at 8:30 A.M., and check in at the Admitting office.  Call this number if you have problems the morning of surgery:  [phone]  Call [phone] if you have any questions prior to your surgery date Monday-Friday 8am-4pm    Remember:  Do not eat after midnight the night before your surgery  You may drink clear liquids until 7:30 AM the morning of your surgery.   Clear liquids allowed are: Water, Non-Citrus Juices (without pulp), Carbonated Beverages, Clear Tea, Black Coffee Only, and Gatorade   Enhanced Recovery after Surgery for Orthopedics Enhanced Recovery after Surgery is a protocol used to improve the stress on your body and your recovery after surgery.  Patient Instructions  . The night before surgery:  o No food after midnight. ONLY clear liquids after midnight  .  Marland Kitchen The day of surgery (if you do NOT have diabetes):  o Drink ONE (1) Pre-Surgery Clear Ensure as directed.   o This drink was given to you during your hospital  pre-op appointment visit. o The pre-op nurse will instruct you on the time to drink the  Pre-Surgery Ensure depending on your surgery time. o Finish the drink at the designated time by the pre-op nurse.  o Nothing else to drink after completing the  Pre-Surgery Clear Ensure.          If you have questions, please contact your surgeon's office.      Take these medicines the morning of surgery with A SIP OF WATER   Amlodipine (Norvasc)  Gabapentin (Neurontin)    7 days prior to surgery STOP taking any Aspirin (unless otherwise instructed by your surgeon), Aleve, Naproxen, Ibuprofen, Motrin, Advil, Goody's, BC's, all herbal medications, fish oil, and all  vitamins.    The Morning of Surgery  Do not wear jewelry, make-up or nail polish.  Do not wear lotions, powders, or perfumes, or deodorant  Do not shave 48 hours prior to surgery.    Do not bring valuables to the hospital.  [facility] is not responsible for any belongings or valuables.  If you are a smoker, DO NOT Smoke 24 hours prior to surgery  If you wear a CPAP at night please bring your mask, tubing, and machine the morning of surgery   Remember that you must have someone to transport you home after your surgery, and remain with you for 24 hours if you are discharged the same day.   Please bring cases for contacts, glasses, hearing aids, dentures or bridgework because it cannot be worn into surgery.    Leave your suitcase in the car.  After surgery it may be brought to your room.  For patients admitted to the hospital, discharge time will be determined by your treatment team.  Patients discharged the day of surgery will not be allowed to drive home.    Special instructions:   [place]- Preparing For Surgery  Before surgery, you can play an important role. Because skin is not sterile, your skin needs to be as free of germs as possible. You can reduce the number of germs on your skin by washing with CHG (chlorahexidine gluconate) Soap before surgery.  CHG is an antiseptic cleaner  which kills germs and bonds with the skin to continue killing germs even after washing.    Oral Hygiene is also important to reduce your risk of infection.  Remember - BRUSH YOUR TEETH THE MORNING OF SURGERY WITH YOUR REGULAR TOOTHPASTE  Please do not use if you have an allergy to CHG or antibacterial soaps. If your skin becomes reddened/irritated stop using the CHG.  Do not shave (including legs and underarms) for at least 48 hours prior to first CHG shower. It is OK to shave your face.  Please follow these instructions carefully.   1. Shower the NIGHT BEFORE SURGERY and the MORNING OF SURGERY  with CHG Soap.   2. If you chose to wash your hair, wash your hair first as usual with your normal shampoo.  3. After you shampoo, rinse your hair and body thoroughly to remove the shampoo.  4. Use CHG as you would any other liquid soap. You can apply CHG directly to the skin and wash gently with a scrungie or a clean washcloth.   5. Apply the CHG Soap to your body ONLY FROM THE NECK DOWN.  Do not use on open wounds or open sores. Avoid contact with your eyes, ears, mouth and genitals (private parts). Wash Face and genitals (private parts)  with your normal soap.   6. Wash thoroughly, paying special attention to the area where your surgery will be performed.  7. Thoroughly rinse your body with warm water from the neck down.  8. DO NOT shower/wash with your normal soap after using and rinsing off the CHG Soap.  9. Pat yourself dry with a CLEAN TOWEL.  10. Wear CLEAN PAJAMAS to bed the night before surgery, wear comfortable clothes the morning of surgery  11. Place CLEAN SHEETS on your bed the night of your first shower and DO NOT SLEEP WITH PETS.    Day of Surgery:  Please shower the morning of surgery with the CHG soap Do not apply any deodorants/lotions. Please wear clean clothes to the hospital/surgery center.   Remember to brush your teeth WITH YOUR REGULAR TOOTHPASTE.   Please read over the following fact sheets that you were given.

## 2019-12-01 ENCOUNTER — Encounter (HOSPITAL_COMMUNITY)
Admission: RE | Admit: 2019-12-01 | Discharge: 2019-12-01 | Disposition: A | Discharge status: Home / Self Care | Payer: Medicaid Other | Source: Ambulatory Visit | Attending: Orthopedic Surgery | Admitting: Orthopedic Surgery

## 2019-12-01 ENCOUNTER — Encounter (HOSPITAL_COMMUNITY): Payer: Self-pay

## 2019-12-01 ENCOUNTER — Other Ambulatory Visit: Payer: Self-pay

## 2019-12-01 DIAGNOSIS — Z01812 Encounter for preprocedural laboratory examination: Secondary | ICD-10-CM | POA: Insufficient documentation

## 2019-12-01 HISTORY — DX: Unspecified osteoarthritis, unspecified site: M19.90

## 2019-12-01 HISTORY — DX: Presence of dental prosthetic device (complete) (partial): Z97.2

## 2019-12-01 LAB — COMPREHENSIVE METABOLIC PANEL
ALT: 17 U/L (ref 0–44)
AST: 23 U/L (ref 15–41)
Albumin: 3.9 g/dL (ref 3.5–5.0)
Alkaline Phosphatase: 100 U/L (ref 38–126)
Anion gap: 11 (ref 5–15)
BUN: 9 mg/dL (ref 8–23)
CO2: 26 mmol/L (ref 22–32)
Calcium: 9.1 mg/dL (ref 8.9–10.3)
Chloride: 101 mmol/L (ref 98–111)
Creatinine, Ser: 0.63 mg/dL (ref 0.44–1.00)
GFR calc Af Amer: >60 mL/min (ref >60)
GFR calc non Af Amer: >60 mL/min (ref >60)
Glucose, Bld: 93 mg/dL (ref 70–99)
Potassium: 3.6 mmol/L (ref 3.5–5.1)
Sodium: 138 mmol/L (ref 135–145)
Total Bilirubin: 0.4 mg/dL (ref 0.3–1.2)
Total Protein: 6.6 g/dL (ref 6.5–8.1)

## 2019-12-01 LAB — SURGICAL PCR SCREEN
MRSA, PCR: NEGATIVE
Staphylococcus aureus: NEGATIVE

## 2019-12-01 LAB — CBC WITH DIFFERENTIAL/PLATELET
Abs Immature Granulocytes: 0.02 10*3/uL (ref 0.00–0.07)
Basophils Absolute: 0.1 10*3/uL (ref 0.0–0.1)
Basophils Relative: 1 %
Eosinophils Absolute: 0.2 10*3/uL (ref 0.0–0.5)
Eosinophils Relative: 4 %
HCT: 40.2 % (ref 36.0–46.0)
Hemoglobin: 13.2 g/dL (ref 12.0–15.0)
Immature Granulocytes: 0 %
Lymphocytes Relative: 40 %
Lymphs Abs: 2.4 10*3/uL (ref 0.7–4.0)
MCH: 33 pg (ref 26.0–34.0)
MCHC: 32.8 g/dL (ref 30.0–36.0)
MCV: 100.5 fL — ABNORMAL HIGH (ref 80.0–100.0)
Monocytes Absolute: 0.5 10*3/uL (ref 0.1–1.0)
Monocytes Relative: 7 %
Neutro Abs: 2.9 10*3/uL (ref 1.7–7.7)
Neutrophils Relative %: 48 %
Platelets: 284 10*3/uL (ref 150–400)
RBC: 4 MIL/uL (ref 3.87–5.11)
RDW: 12 % (ref 11.5–15.5)
WBC: 6.1 10*3/uL (ref 4.0–10.5)
nRBC: 0 % (ref 0.0–0.2)

## 2019-12-01 LAB — URINALYSIS, ROUTINE W REFLEX MICROSCOPIC
Bilirubin Urine: NEGATIVE
Glucose, UA: NEGATIVE mg/dL
Hgb urine dipstick: NEGATIVE
Ketones, ur: NEGATIVE mg/dL
Leukocytes,Ua: NEGATIVE
Nitrite: NEGATIVE
Protein, ur: NEGATIVE mg/dL
Specific Gravity, Urine: 1.018 (ref 1.005–1.030)
pH: 6 (ref 5.0–8.0)

## 2019-12-01 LAB — PROTIME-INR
INR: 0.9 (ref 0.8–1.2)
Prothrombin Time: 12.5 seconds (ref 11.4–15.2)

## 2019-12-01 LAB — APTT: aPTT: 29 seconds (ref 24–36)

## 2019-12-01 NOTE — Progress Notes (Signed)
Patient denies shortness of breath, fever, cough and chest pain.  PCP - Drue Second, PA-C Cardiologist - denies  Chest x-ray - denies EKG - 09/19/19 Stress Test - denies ECHO - 10/01/19 Cardiac Cath - denies  ERAS: Clears til 0725 DOS, Drink given at PAT appointment.Please complete your PRE-SURGERY ENSURE that was provided to you by 0725 the morning of surgery.  Please, if able, drink it in one setting. DO NOT SIP.  Anesthesia review: No  7 days prior to surgery STOP taking any Aspirin (unless otherwise instructed by your surgeon), Aleve, Naproxen, Ibuprofen, Motrin, Advil, Goody's, BC's, all herbal medications, fish oil, and all vitamins.   Coronavirus Screening Have you experienced the following symptoms:  Cough yes/no: No Fever (>100.39F)  yes/no: No Runny nose yes/no: No Sore throat yes/no: No Difficulty breathing/shortness of breath  yes/no: No  Have you traveled in the last 14 days and where? yes/no: No  Patient verbalized understanding of instructions that were given to them at the PAT appointment.

## 2019-12-02 LAB — URINE CULTURE: Culture: NO GROWTH

## 2019-12-08 NOTE — Care Plan (Signed)
Ortho Bundle Case Management Note  Patient Details  Name: Rita Newton MRN: [id number] Date of Birth: 1955/12/07   Will discharge to home with family. HHPT referral to [facility]. OPPT to be set up at MD follow up visit.  Rolling walker ordered from [facility]. Choice offered            DME Arranged:  Walker rolling DME Agency:  [employer]  HH Arranged:  PT [place] Agency:  [facility] ([facility])  Additional Comments: Please contact me with any questions of if this plan should need to change.  Ladell Heads,  [facility]  [phone] 12/08/2019, 9:52 AM

## 2019-12-09 ENCOUNTER — Other Ambulatory Visit (HOSPITAL_COMMUNITY)
Admission: RE | Admit: 2019-12-09 | Discharge: 2019-12-09 | Disposition: A | Discharge status: Home / Self Care | Payer: Medicaid Other | Source: Ambulatory Visit | Attending: Orthopedic Surgery | Admitting: Orthopedic Surgery

## 2019-12-09 DIAGNOSIS — Z20822 Contact with and (suspected) exposure to covid-19: Secondary | ICD-10-CM | POA: Insufficient documentation

## 2019-12-09 DIAGNOSIS — Z01812 Encounter for preprocedural laboratory examination: Secondary | ICD-10-CM | POA: Insufficient documentation

## 2019-12-10 LAB — NOVEL CORONAVIRUS, NAA (HOSP ORDER, SEND-OUT TO REF LAB; TAT 18-24 HRS): SARS-CoV-2, NAA: NOT DETECTED

## 2019-12-11 MED ORDER — BUPIVACAINE LIPOSOME 1.3 % IJ SUSP
20.0000 mL | Freq: Once | INTRAMUSCULAR | Status: DC
  Filled 2019-12-11: qty 20

## 2019-12-11 MED ORDER — TRANEXAMIC ACID 1000 MG/10ML IV SOLN
2000.0000 mg | INTRAVENOUS | Status: DC
  Filled 2019-12-11 (×2): qty 20

## 2019-12-12 ENCOUNTER — Encounter (HOSPITAL_COMMUNITY)
Admission: RE | Disposition: A | Discharge status: Home / Self Care | Payer: Self-pay | Source: Home / Self Care | Attending: Orthopedic Surgery

## 2019-12-12 ENCOUNTER — Ambulatory Visit (HOSPITAL_COMMUNITY): Payer: Medicaid Other | Admitting: Anesthesiology

## 2019-12-12 ENCOUNTER — Ambulatory Visit (HOSPITAL_COMMUNITY)
Admission: RE | Admit: 2019-12-12 | Discharge: 2019-12-12 | Disposition: A | Discharge status: Home / Self Care | Payer: Medicaid Other | Attending: Orthopedic Surgery | Admitting: Orthopedic Surgery

## 2019-12-12 ENCOUNTER — Encounter (HOSPITAL_COMMUNITY): Payer: Self-pay | Admitting: Orthopedic Surgery

## 2019-12-12 ENCOUNTER — Other Ambulatory Visit: Payer: Self-pay

## 2019-12-12 DIAGNOSIS — F329 Major depressive disorder, single episode, unspecified: Secondary | ICD-10-CM | POA: Diagnosis not present

## 2019-12-12 DIAGNOSIS — M25461 Effusion, right knee: Secondary | ICD-10-CM | POA: Insufficient documentation

## 2019-12-12 DIAGNOSIS — E669 Obesity, unspecified: Secondary | ICD-10-CM | POA: Insufficient documentation

## 2019-12-12 DIAGNOSIS — M1711 Unilateral primary osteoarthritis, right knee: Secondary | ICD-10-CM | POA: Diagnosis present

## 2019-12-12 DIAGNOSIS — Z79899 Other long term (current) drug therapy: Secondary | ICD-10-CM | POA: Insufficient documentation

## 2019-12-12 DIAGNOSIS — Z6824 Body mass index (BMI) 24.0-24.9, adult: Secondary | ICD-10-CM | POA: Insufficient documentation

## 2019-12-12 DIAGNOSIS — M21161 Varus deformity, not elsewhere classified, right knee: Secondary | ICD-10-CM | POA: Insufficient documentation

## 2019-12-12 DIAGNOSIS — F419 Anxiety disorder, unspecified: Secondary | ICD-10-CM | POA: Insufficient documentation

## 2019-12-12 DIAGNOSIS — M5106 Intervertebral disc disorders with myelopathy, lumbar region: Secondary | ICD-10-CM | POA: Insufficient documentation

## 2019-12-12 DIAGNOSIS — M25861 Other specified joint disorders, right knee: Secondary | ICD-10-CM | POA: Insufficient documentation

## 2019-12-12 DIAGNOSIS — F17210 Nicotine dependence, cigarettes, uncomplicated: Secondary | ICD-10-CM | POA: Insufficient documentation

## 2019-12-12 DIAGNOSIS — K589 Irritable bowel syndrome without diarrhea: Secondary | ICD-10-CM | POA: Insufficient documentation

## 2019-12-12 DIAGNOSIS — I10 Essential (primary) hypertension: Secondary | ICD-10-CM | POA: Diagnosis not present

## 2019-12-12 DIAGNOSIS — G8929 Other chronic pain: Secondary | ICD-10-CM | POA: Insufficient documentation

## 2019-12-12 DIAGNOSIS — M25761 Osteophyte, right knee: Secondary | ICD-10-CM | POA: Diagnosis not present

## 2019-12-12 HISTORY — PX: TOTAL KNEE ARTHROPLASTY: SHX125

## 2019-12-12 SURGERY — ARTHROPLASTY, KNEE, TOTAL
Anesthesia: Regional | Site: Knee | Laterality: Right

## 2019-12-12 MED ORDER — TRANEXAMIC ACID-NACL 1000-0.7 MG/100ML-% IV SOLN
INTRAVENOUS | Status: AC
  Filled 2019-12-12: qty 100

## 2019-12-12 MED ORDER — PHENOL 1.4 % MT LIQD: 1.0000 | OROMUCOSAL | Status: DC | PRN

## 2019-12-12 MED ORDER — LACTATED RINGERS IV SOLN: INTRAVENOUS | Status: DC

## 2019-12-12 MED ORDER — FENTANYL CITRATE (PF) 100 MCG/2ML IJ SOLN
INTRAMUSCULAR | Status: DC | PRN
  Administered 2019-12-12 (×2): 50 ug via INTRAVENOUS
  Administered 2019-12-12: 100 ug via INTRAVENOUS
  Administered 2019-12-12: 50 ug via INTRAVENOUS

## 2019-12-12 MED ORDER — OXYCODONE HCL 5 MG/5ML PO SOLN: 5.0000 mg | Freq: Once | ORAL | Status: AC | PRN

## 2019-12-12 MED ORDER — LACTATED RINGERS IV BOLUS: 500.0000 mL | Freq: Once | INTRAVENOUS | Status: DC

## 2019-12-12 MED ORDER — PROPOFOL 10 MG/ML IV BOLUS
INTRAVENOUS | Status: DC | PRN
  Administered 2019-12-12 (×2): 20 mg via INTRAVENOUS
  Administered 2019-12-12: 130 mg via INTRAVENOUS
  Administered 2019-12-12: 30 mg via INTRAVENOUS

## 2019-12-12 MED ORDER — EPINEPHRINE PF 1 MG/ML IJ SOLN
INTRAMUSCULAR | Status: AC
  Filled 2019-12-12: qty 1

## 2019-12-12 MED ORDER — LISINOPRIL 10 MG PO TABS: 10.0000 mg | ORAL_TABLET | Freq: Every day | ORAL | Status: DC

## 2019-12-12 MED ORDER — BUPIVACAINE LIPOSOME 1.3 % IJ SUSP
INTRAMUSCULAR | Status: DC | PRN
  Administered 2019-12-12: 20 mL

## 2019-12-12 MED ORDER — DIPHENHYDRAMINE HCL 12.5 MG/5ML PO ELIX: 12.5000 mg | ORAL_SOLUTION | ORAL | Status: DC | PRN

## 2019-12-12 MED ORDER — SODIUM CHLORIDE 0.9% FLUSH
INTRAVENOUS | Status: DC | PRN
  Administered 2019-12-12: 50 mL via INTRADERMAL

## 2019-12-12 MED ORDER — LIDOCAINE 2% (20 MG/ML) 5 ML SYRINGE
INTRAMUSCULAR | Status: AC
  Filled 2019-12-12: qty 5

## 2019-12-12 MED ORDER — BUPIVACAINE HCL (PF) 0.25 % IJ SOLN
INTRAMUSCULAR | Status: AC
  Filled 2019-12-12: qty 60

## 2019-12-12 MED ORDER — BUPIVACAINE HCL (PF) 0.25 % IJ SOLN
INTRAMUSCULAR | Status: DC | PRN
  Administered 2019-12-12: 60 mL

## 2019-12-12 MED ORDER — EPHEDRINE 5 MG/ML INJ
INTRAVENOUS | Status: AC
  Filled 2019-12-12: qty 10

## 2019-12-12 MED ORDER — ACETAMINOPHEN 325 MG PO TABS: 325.0000 mg | ORAL_TABLET | Freq: Four times a day (QID) | ORAL | Status: DC | PRN

## 2019-12-12 MED ORDER — ACETAMINOPHEN 500 MG PO TABS: 1000.0000 mg | ORAL_TABLET | Freq: Four times a day (QID) | ORAL | Status: DC

## 2019-12-12 MED ORDER — LACTATED RINGERS IV BOLUS: 250.0000 mL | Freq: Once | INTRAVENOUS | Status: DC

## 2019-12-12 MED ORDER — SODIUM CHLORIDE 0.9 % IV SOLN: INTRAVENOUS | Status: DC

## 2019-12-12 MED ORDER — ONDANSETRON HCL 4 MG/2ML IJ SOLN: 4.0000 mg | Freq: Four times a day (QID) | INTRAMUSCULAR | Status: DC | PRN

## 2019-12-12 MED ORDER — DEXAMETHASONE SODIUM PHOSPHATE 10 MG/ML IJ SOLN
INTRAMUSCULAR | Status: DC | PRN
  Administered 2019-12-12: 10 mg via INTRAVENOUS

## 2019-12-12 MED ORDER — AMLODIPINE BESYLATE 10 MG PO TABS: 10.0000 mg | ORAL_TABLET | Freq: Every day | ORAL | Status: DC

## 2019-12-12 MED ORDER — BISACODYL 10 MG RE SUPP: 10.0000 mg | Freq: Every day | RECTAL | Status: DC | PRN

## 2019-12-12 MED ORDER — OXYCODONE HCL 5 MG PO TABS: ORAL_TABLET | ORAL | 0 refills | Status: AC

## 2019-12-12 MED ORDER — METHOCARBAMOL 1000 MG/10ML IJ SOLN: 500.0000 mg | Freq: Four times a day (QID) | INTRAVENOUS | Status: DC | PRN

## 2019-12-12 MED ORDER — LIDOCAINE HCL (CARDIAC) PF 100 MG/5ML IV SOSY
PREFILLED_SYRINGE | INTRAVENOUS | Status: DC | PRN
  Administered 2019-12-12: 60 mg via INTRATRACHEAL

## 2019-12-12 MED ORDER — SODIUM CHLORIDE 0.9 % IR SOLN
Status: DC | PRN
  Administered 2019-12-12: 3000 mL

## 2019-12-12 MED ORDER — ALBUMIN HUMAN 5 % IV SOLN: INTRAVENOUS | Status: DC | PRN

## 2019-12-12 MED ORDER — POLYETHYLENE GLYCOL 3350 17 G PO PACK: 17.0000 g | PACK | Freq: Every day | ORAL | Status: DC | PRN

## 2019-12-12 MED ORDER — ASPIRIN 81 MG PO CHEW: 81.0000 mg | CHEWABLE_TABLET | Freq: Two times a day (BID) | ORAL | Status: DC

## 2019-12-12 MED ORDER — POVIDONE-IODINE 10 % EX SWAB: 2.0000 "application " | Freq: Once | CUTANEOUS | Status: DC

## 2019-12-12 MED ORDER — MIDAZOLAM HCL 2 MG/2ML IJ SOLN
2.0000 mg | Freq: Once | INTRAMUSCULAR | Status: AC
  Administered 2019-12-12: 2 mg via INTRAVENOUS

## 2019-12-12 MED ORDER — MENTHOL 3 MG MT LOZG: 1.0000 | LOZENGE | OROMUCOSAL | Status: DC | PRN

## 2019-12-12 MED ORDER — TRANEXAMIC ACID 1000 MG/10ML IV SOLN
INTRAVENOUS | Status: DC | PRN
  Administered 2019-12-12: 13:00:00 2000 mg via TOPICAL

## 2019-12-12 MED ORDER — METHOCARBAMOL 500 MG PO TABS
500.0000 mg | ORAL_TABLET | Freq: Four times a day (QID) | ORAL | Status: DC | PRN
  Administered 2019-12-12: 14:00:00 500 mg via ORAL

## 2019-12-12 MED ORDER — FENTANYL CITRATE (PF) 250 MCG/5ML IJ SOLN
INTRAMUSCULAR | Status: AC
  Filled 2019-12-12: qty 5

## 2019-12-12 MED ORDER — DEXAMETHASONE SODIUM PHOSPHATE 10 MG/ML IJ SOLN
INTRAMUSCULAR | Status: AC
  Filled 2019-12-12: qty 1

## 2019-12-12 MED ORDER — FENTANYL CITRATE (PF) 100 MCG/2ML IJ SOLN: 50.0000 ug | Freq: Once | INTRAMUSCULAR | Status: DC

## 2019-12-12 MED ORDER — FENTANYL CITRATE (PF) 100 MCG/2ML IJ SOLN
25.0000 ug | INTRAMUSCULAR | Status: DC | PRN
  Administered 2019-12-12: 14:00:00 50 ug via INTRAVENOUS

## 2019-12-12 MED ORDER — FENTANYL CITRATE (PF) 100 MCG/2ML IJ SOLN
INTRAMUSCULAR | Status: AC
  Filled 2019-12-12: qty 2

## 2019-12-12 MED ORDER — OXYCODONE HCL 5 MG PO TABS: 5.0000 mg | ORAL_TABLET | ORAL | Status: DC | PRN

## 2019-12-12 MED ORDER — ROPIVACAINE HCL 5 MG/ML IJ SOLN
INTRAMUSCULAR | Status: DC | PRN
  Administered 2019-12-12: 25 mL via PERINEURAL

## 2019-12-12 MED ORDER — MIDAZOLAM HCL 2 MG/2ML IJ SOLN
INTRAMUSCULAR | Status: AC
  Filled 2019-12-12: qty 2

## 2019-12-12 MED ORDER — ONDANSETRON HCL 4 MG/2ML IJ SOLN
INTRAMUSCULAR | Status: AC
  Filled 2019-12-12: qty 2

## 2019-12-12 MED ORDER — METOCLOPRAMIDE HCL 5 MG PO TABS: 5.0000 mg | ORAL_TABLET | Freq: Three times a day (TID) | ORAL | Status: DC | PRN

## 2019-12-12 MED ORDER — FLEET ENEMA 7-19 GM/118ML RE ENEM: 1.0000 | ENEMA | Freq: Once | RECTAL | Status: DC | PRN

## 2019-12-12 MED ORDER — ACETAMINOPHEN 325 MG PO TABS: 650.0000 mg | ORAL_TABLET | Freq: Four times a day (QID) | ORAL | 0 refills | Status: AC | PRN

## 2019-12-12 MED ORDER — CEFAZOLIN SODIUM-DEXTROSE 2-4 GM/100ML-% IV SOLN
2.0000 g | INTRAVENOUS | Status: AC
  Administered 2019-12-12: 11:00:00 2 g via INTRAVENOUS
  Filled 2019-12-12: qty 100

## 2019-12-12 MED ORDER — CEFAZOLIN SODIUM-DEXTROSE 1-4 GM/50ML-% IV SOLN: 1.0000 g | Freq: Four times a day (QID) | INTRAVENOUS | Status: DC

## 2019-12-12 MED ORDER — OXYCODONE HCL 5 MG PO TABS
5.0000 mg | ORAL_TABLET | Freq: Once | ORAL | Status: AC | PRN
  Administered 2019-12-12: 14:00:00 5 mg via ORAL

## 2019-12-12 MED ORDER — DOCUSATE SODIUM 100 MG PO CAPS: 100.0000 mg | ORAL_CAPSULE | Freq: Two times a day (BID) | ORAL | Status: DC

## 2019-12-12 MED ORDER — PROPOFOL 500 MG/50ML IV EMUL
INTRAVENOUS | Status: DC | PRN
  Administered 2019-12-12: 50 ug/kg/min via INTRAVENOUS

## 2019-12-12 MED ORDER — METHOCARBAMOL 500 MG PO TABS
ORAL_TABLET | ORAL | Status: AC
  Filled 2019-12-12: qty 1

## 2019-12-12 MED ORDER — METHOCARBAMOL 500 MG PO TABS: 500.0000 mg | ORAL_TABLET | Freq: Four times a day (QID) | ORAL | 0 refills | Status: AC | PRN

## 2019-12-12 MED ORDER — DULOXETINE HCL 30 MG PO CPEP: 30.0000 mg | ORAL_CAPSULE | Freq: Every day | ORAL | Status: DC

## 2019-12-12 MED ORDER — HYDROMORPHONE HCL 1 MG/ML IJ SOLN: 0.5000 mg | INTRAMUSCULAR | Status: DC | PRN

## 2019-12-12 MED ORDER — METOCLOPRAMIDE HCL 5 MG/ML IJ SOLN: 5.0000 mg | Freq: Three times a day (TID) | INTRAMUSCULAR | Status: DC | PRN

## 2019-12-12 MED ORDER — ONDANSETRON HCL 4 MG PO TABS: 4.0000 mg | ORAL_TABLET | Freq: Four times a day (QID) | ORAL | Status: DC | PRN

## 2019-12-12 MED ORDER — TRANEXAMIC ACID-NACL 1000-0.7 MG/100ML-% IV SOLN
1000.0000 mg | INTRAVENOUS | Status: AC
  Administered 2019-12-12: 1000 mg via INTRAVENOUS
  Filled 2019-12-12: qty 100

## 2019-12-12 MED ORDER — ASPIRIN EC 81 MG PO TBEC: 81.0000 mg | DELAYED_RELEASE_TABLET | Freq: Two times a day (BID) | ORAL | 0 refills | Status: AC

## 2019-12-12 MED ORDER — EPINEPHRINE PF 1 MG/ML IJ SOLN
INTRAMUSCULAR | Status: DC | PRN
  Administered 2019-12-12: .3 mL

## 2019-12-12 MED ORDER — GABAPENTIN 600 MG PO TABS: 600.0000 mg | ORAL_TABLET | Freq: Three times a day (TID) | ORAL | Status: DC

## 2019-12-12 MED ORDER — CHLORHEXIDINE GLUCONATE 4 % EX LIQD: 60.0000 mL | Freq: Once | CUTANEOUS | Status: DC

## 2019-12-12 MED ORDER — TRANEXAMIC ACID-NACL 1000-0.7 MG/100ML-% IV SOLN: 1000.0000 mg | Freq: Once | INTRAVENOUS | Status: DC

## 2019-12-12 MED ORDER — OXYCODONE HCL 5 MG PO TABS
ORAL_TABLET | ORAL | Status: AC
  Filled 2019-12-12: qty 1

## 2019-12-12 MED ORDER — EPHEDRINE SULFATE 50 MG/ML IJ SOLN
INTRAMUSCULAR | Status: DC | PRN
  Administered 2019-12-12: 15 mg via INTRAVENOUS
  Administered 2019-12-12: 10 mg via INTRAVENOUS

## 2019-12-12 MED ORDER — PHENYLEPHRINE HCL-NACL 10-0.9 MG/250ML-% IV SOLN
INTRAVENOUS | Status: DC | PRN
  Administered 2019-12-12: 50 ug/min via INTRAVENOUS

## 2019-12-12 MED ORDER — ONDANSETRON HCL 4 MG/2ML IJ SOLN
INTRAMUSCULAR | Status: DC | PRN
  Administered 2019-12-12: 4 mg via INTRAVENOUS

## 2019-12-12 SURGICAL SUPPLY — 75 items
ATTUNE MED DOME PAT 38 KNEE (Knees) ×2 IMPLANT
ATTUNE MED DOME PAT 38MM KNEE (Knees) ×1 IMPLANT
ATTUNE PS FEM RT SZ 5 CEM KNEE (Femur) ×3 IMPLANT
ATTUNE PSRP INSE SZ 5 7MM KNEE (Insert) ×1 IMPLANT
ATTUNE PSRP INSE SZ5 7 KNEE (Insert) ×2 IMPLANT
BANDAGE ESMARK 6X9 LF (GAUZE/BANDAGES/DRESSINGS) ×1 IMPLANT
BASE TIBIA ATTUNE KNEE SYS SZ6 (Knees) ×1 IMPLANT
BENZOIN TINCTURE PRP APPL 2/3 (GAUZE/BANDAGES/DRESSINGS) ×3 IMPLANT
BLADE SAGITTAL 25.0X1.19X90 (BLADE) ×2 IMPLANT
BLADE SAGITTAL 25.0X1.19X90MM (BLADE) ×1
BLADE SAW SAG 90X13X1.27 (BLADE) ×3 IMPLANT
BNDG ELASTIC 4X5.8 VLCR STR LF (GAUZE/BANDAGES/DRESSINGS) ×3 IMPLANT
BNDG ELASTIC 6X5.8 VLCR STR LF (GAUZE/BANDAGES/DRESSINGS) ×3 IMPLANT
BNDG ESMARK 6X9 LF (GAUZE/BANDAGES/DRESSINGS) ×3
BOWL SMART MIX CTS (DISPOSABLE) ×3 IMPLANT
CEMENT HV SMART SET (Cement) ×6 IMPLANT
CLOSURE STERI-STRIP 1/2X4 (GAUZE/BANDAGES/DRESSINGS) ×1
CLSR STERI-STRIP ANTIMIC 1/2X4 (GAUZE/BANDAGES/DRESSINGS) ×2 IMPLANT
COVER SURGICAL LIGHT HANDLE (MISCELLANEOUS) ×3 IMPLANT
COVER WAND RF STERILE (DRAPES) ×3 IMPLANT
CUFF TOURN SGL QUICK 34 (TOURNIQUET CUFF) ×2
CUFF TOURN SGL QUICK 42 (TOURNIQUET CUFF) IMPLANT
CUFF TRNQT CYL 34X4.125X (TOURNIQUET CUFF) ×1 IMPLANT
DRAPE INCISE IOBAN 66X45 STRL (DRAPES) IMPLANT
DRAPE ORTHO SPLIT 77X108 STRL (DRAPES) ×4
DRAPE SURG ORHT 6 SPLT 77X108 (DRAPES) ×2 IMPLANT
DRAPE U-SHAPE 47X51 STRL (DRAPES) ×3 IMPLANT
DRSG ADAPTIC 3X8 NADH LF (GAUZE/BANDAGES/DRESSINGS) ×3 IMPLANT
DRSG AQUACEL AG ADV 3.5X10 (GAUZE/BANDAGES/DRESSINGS) ×3 IMPLANT
DRSG PAD ABDOMINAL 8X10 ST (GAUZE/BANDAGES/DRESSINGS) ×6 IMPLANT
DURAPREP 26ML APPLICATOR (WOUND CARE) ×3 IMPLANT
ELECT REM PT RETURN 9FT ADLT (ELECTROSURGICAL) ×3
ELECTRODE REM PT RTRN 9FT ADLT (ELECTROSURGICAL) ×1 IMPLANT
EVACUATOR 1/8 PVC DRAIN (DRAIN) IMPLANT
FACESHIELD WRAPAROUND (MASK) ×3 IMPLANT
GAUZE SPONGE 4X4 12PLY STRL (GAUZE/BANDAGES/DRESSINGS) ×3 IMPLANT
GLOVE BIOGEL PI IND STRL 8 (GLOVE) ×4 IMPLANT
GLOVE BIOGEL PI INDICATOR 8 (GLOVE) ×8
GLOVE ORTHO TXT STRL SZ7.5 (GLOVE) ×3 IMPLANT
GLOVE SURG ORTHO 8.0 STRL STRW (GLOVE) ×3 IMPLANT
GOWN STRL REUS W/ TWL LRG LVL3 (GOWN DISPOSABLE) ×1 IMPLANT
GOWN STRL REUS W/ TWL XL LVL3 (GOWN DISPOSABLE) ×1 IMPLANT
GOWN STRL REUS W/TWL 2XL LVL3 (GOWN DISPOSABLE) ×3 IMPLANT
GOWN STRL REUS W/TWL LRG LVL3 (GOWN DISPOSABLE) ×2
GOWN STRL REUS W/TWL XL LVL3 (GOWN DISPOSABLE) ×2
HANDPIECE INTERPULSE COAX TIP (DISPOSABLE) ×2
HOOD PEEL AWAY FACE SHEILD DIS (HOOD) ×3 IMPLANT
IMMOBILIZER KNEE 22 UNIV (SOFTGOODS) ×3 IMPLANT
KIT BASIN OR (CUSTOM PROCEDURE TRAY) ×3 IMPLANT
KIT TURNOVER KIT B (KITS) ×3 IMPLANT
MANIFOLD NEPTUNE II (INSTRUMENTS) ×3 IMPLANT
NEEDLE 22X1 1/2 (OR ONLY) (NEEDLE) ×6 IMPLANT
NS IRRIG 1000ML POUR BTL (IV SOLUTION) ×3 IMPLANT
PACK TOTAL JOINT (CUSTOM PROCEDURE TRAY) ×3 IMPLANT
PAD ARMBOARD 7.5X6 YLW CONV (MISCELLANEOUS) ×6 IMPLANT
PAD CAST 4YDX4 CTTN HI CHSV (CAST SUPPLIES) ×1 IMPLANT
PADDING CAST COTTON 4X4 STRL (CAST SUPPLIES) ×2
PADDING CAST COTTON 6X4 STRL (CAST SUPPLIES) ×3 IMPLANT
PIN DRILL FIX HALF THREAD (BIT) ×3 IMPLANT
PIN STEINMAN FIXATION KNEE (PIN) ×3 IMPLANT
SET HNDPC FAN SPRY TIP SCT (DISPOSABLE) ×1 IMPLANT
STAPLER VISISTAT 35W (STAPLE) ×3 IMPLANT
SUCTION FRAZIER HANDLE 10FR (MISCELLANEOUS) ×2
SUCTION TUBE FRAZIER 10FR DISP (MISCELLANEOUS) ×1 IMPLANT
SUT ETHIBOND NAB CT1 #1 30IN (SUTURE) ×9 IMPLANT
SUT VIC AB 0 CT1 27 (SUTURE) ×2
SUT VIC AB 0 CT1 27XBRD ANBCTR (SUTURE) ×1 IMPLANT
SUT VIC AB 2-0 CT1 27 (SUTURE) ×4
SUT VIC AB 2-0 CT1 TAPERPNT 27 (SUTURE) ×2 IMPLANT
SYR CONTROL 10ML LL (SYRINGE) ×6 IMPLANT
TIBIA ATTUNE KNEE SYS BASE SZ6 (Knees) ×3 IMPLANT
TOWEL GREEN STERILE (TOWEL DISPOSABLE) ×3 IMPLANT
TRAY CATH 16FR W/PLASTIC CATH (SET/KITS/TRAYS/PACK) IMPLANT
TRAY FOLEY MTR SLVR 16FR STAT (SET/KITS/TRAYS/PACK) IMPLANT
WATER STERILE IRR 1000ML POUR (IV SOLUTION) IMPLANT

## 2019-12-12 NOTE — Transfer of Care (Signed)
Immediate Anesthesia Transfer of Care Note  Patient: Rita Newton  Procedure(s) Performed: TOTAL KNEE ARTHROPLASTY (Right Knee)  Patient Location: PACU  Anesthesia Type:General and Regional  Level of Consciousness: awake, alert , oriented and sedated  Airway & Oxygen Therapy: Patient Spontanous Breathing and Patient connected to nasal cannula oxygen  Post-op Assessment: Report given to RN, Post -op Vital signs reviewed and stable and Patient moving all extremities  Post vital signs: Reviewed and stable  Last Vitals:  Vitals Value Taken Time  BP 142/88 12/12/19 1324  Temp    Pulse 97 12/12/19 1329  Resp 24 12/12/19 1329  SpO2 100 % 12/12/19 1329  Vitals shown include unvalidated device data.  Last Pain:  Vitals:   12/12/19 0850  TempSrc: Oral  PainSc:       Patients Stated Pain Goal: 3 ([id number])  Complications: No apparent anesthesia complications

## 2019-12-12 NOTE — Brief Op Note (Signed)
12/12/2019  1:16 PM  PATIENT:  Rita Newton  64 y.o. female  PRE-OPERATIVE DIAGNOSIS:  OA RIGHT  KNEE  POST-OPERATIVE DIAGNOSIS:  OA RIGHT  KNEE  PROCEDURE:  Procedure(s): TOTAL KNEE ARTHROPLASTY (Right)  SURGEON:  Surgeon(s) and Role:    Earlie Server, MD - Primary  PHYSICIAN ASSISTANT: Chriss Czar, PA-C  ASSISTANTS: OR staff x1   ANESTHESIA:   local, regional and general  EBL:  minimal   BLOOD ADMINISTERED:none  DRAINS: none   LOCAL MEDICATIONS USED:  MARCAINE     SPECIMEN:  No Specimen  DISPOSITION OF SPECIMEN:  N/A  COUNTS:  YES  TOURNIQUET:   Total Tourniquet Time Documented: Thigh (Right) - -[id number] minutes Total: Thigh (Right) - [id number] minutes   DICTATION: .Other Dictation: Dictation Number unknown  PLAN OF CARE: Discharge to home after PACU  PATIENT DISPOSITION:  PACU - hemodynamically stable.   Delay start of Pharmacological VTE agent (>24hrs) due to surgical blood loss or risk of bleeding: yes

## 2019-12-12 NOTE — Anesthesia Procedure Notes (Signed)
Anesthesia Regional Block: Adductor canal block   Pre-Anesthetic Checklist: ,, timeout performed, Correct Patient, Correct Site, Correct Laterality, Correct Procedure, Correct Position, site marked, Risks and benefits discussed,  Surgical consent,  Pre-op evaluation,  At surgeon's request and post-op pain management  Laterality: Right  Prep: chloraprep       Needles:  Injection technique: Single-shot  Needle Type: Echogenic Needle     Needle Length: 9cm  Needle Gauge: 21     Additional Needles:   Narrative:  Start time: 12/12/2019 10:38 AM End time: 12/12/2019 10:42 AM Injection made incrementally with aspirations every 5 mL.  Performed by: Personally  Anesthesiologist: Albertha Ghee, MD  Additional Notes: Pt tolerated the procedure well.

## 2019-12-12 NOTE — Evaluation (Signed)
Physical Therapy Evaluation Patient Details Name: Rita Newton MRN: [id number] DOB: [birth year] Today's Date: 12/12/2019   History of Present Illness  Pt is a 64 y/o female s/p R TKA. PMH includes HTN, s/p L shoulder surgery, and s/p back surgery.   Clinical Impression  Pt is s/p surgery above with deficits below. Pt with increased pain which limited gait distance. Was able to tolerate gait and stair training with min guard A and RW. Reviewed knee precautions and RLE exercises for home. Pt to be d/c'd home from PACU and reports she feels comfortable with mobility. Reports children will be able to assist at d/c.     Follow Up Recommendations Follow surgeon's recommendation for DC plan and follow-up therapies    Equipment Recommendations  None recommended by PT    Recommendations for Other Services       Precautions / Restrictions Precautions Precautions: Knee Precaution Booklet Issued: Yes (comment) Precaution Comments: Reviewed knee precautions and RLE exercise handout Required Braces or Orthoses: Knee Immobilizer - Right Restrictions Weight Bearing Restrictions: Yes RLE Weight Bearing: Weight bearing as tolerated      Mobility  Bed Mobility Overal bed mobility: Needs Assistance Bed Mobility: Supine to Sit;Sit to Supine     Supine to sit: Supervision Sit to supine: Supervision   General bed mobility comments: Supervision for safety. Increased time secondary to pain.   Transfers Overall transfer level: Needs assistance Equipment used: Rolling walker (2 wheeled) Transfers: Sit to/from Stand Sit to Stand: Min guard         General transfer comment: Min guard for safety. Cues for safe hand placement.   Ambulation/Gait Ambulation/Gait assistance: Min guard Gait Distance (Feet): 100 Feet Assistive device: Rolling walker (2 wheeled) Gait Pattern/deviations: Step-to pattern;Decreased step length - right;Decreased step length - left;Decreased weight shift to  right;Antalgic Gait velocity: Decreased    General Gait Details: Slow, antalgic gait. Decreased weightshift to RLE secondary to pain. Cues for sequencing using RW.   Stairs Stairs: Yes Stairs assistance: Min guard Stair Management: Step to pattern;Backwards;Forwards;With walker Number of Stairs: 1 General stair comments: Ascended step backwards and descended forwards. Min guard for safety and cues for LE sequencing. Reviewed handout regarding how to navigate stairs using RW.   Wheelchair Mobility    Modified Rankin (Stroke Patients Only)       Balance Overall balance assessment: Needs assistance Sitting-balance support: No upper extremity supported;Feet supported Sitting balance-Leahy Scale: Good     Standing balance support: Bilateral upper extremity supported;During functional activity Standing balance-Leahy Scale: Poor Standing balance comment: Reliant on BUE support                              Pertinent Vitals/Pain Pain Assessment: Faces Faces Pain Scale: Hurts even more Pain Location: R knee  Pain Descriptors / Indicators: Aching;Operative site guarding Pain Intervention(s): Limited activity within patient's tolerance;Monitored during session;Repositioned    Home Living Family/patient expects to be discharged to:: Private residence Living Arrangements: Children Available Help at Discharge: Family;Available 24 hours/day Type of Home: House Home Access: Stairs to enter Entrance Stairs-Rails: None Entrance Stairs-Number of Steps: 2 Home Layout: One level Home Equipment: Walker - 2 wheels;Bedside commode      Prior Function Level of Independence: Independent               Hand Dominance        Extremity/Trunk Assessment   Upper Extremity Assessment Upper Extremity Assessment: Overall [facility]  for tasks assessed    Lower Extremity Assessment Lower Extremity Assessment: RLE deficits/detail RLE Deficits / Details: Deficits consistent with  post op pain and weakness.     Cervical / Trunk Assessment Cervical / Trunk Assessment: Normal  Communication   Communication: No difficulties  Cognition Arousal/Alertness: Awake/alert Behavior During Therapy: WFL for tasks assessed/performed Overall Cognitive Status: Within Functional Limits for tasks assessed                                        General Comments      Exercises Total Joint Exercises Ankle Circles/Pumps: AROM;Both;20 reps;Supine   Assessment/Plan    PT Assessment Patient needs continued PT services  PT Problem List Decreased strength;Decreased range of motion;Decreased activity tolerance;Decreased balance;Decreased mobility;Decreased knowledge of use of DME;Pain       PT Treatment Interventions DME instruction;Gait training;Stair training;Functional mobility training;Therapeutic activities;Therapeutic exercise;Balance training;Patient/family education    PT Goals (Current goals can be found in the Care Plan section)  Acute Rehab PT Goals Patient Stated Goal: to decrease pain  PT Goal Formulation: With patient Time For Goal Achievement: 12/26/19 Potential to Achieve Goals: Good    Frequency 7X/week   Barriers to discharge        Co-evaluation               AM-PAC PT "6 Clicks" Mobility  Outcome Measure Help needed turning from your back to your side while in a flat bed without using bedrails?: None Help needed moving from lying on your back to sitting on the side of a flat bed without using bedrails?: None Help needed moving to and from a bed to a chair (including a wheelchair)?: A Little Help needed standing up from a chair using your arms (e.g., wheelchair or bedside chair)?: A Little Help needed to walk in hospital room?: A Little Help needed climbing 3-5 steps with a railing? : A Little 6 Click Score: 20    End of Session Equipment Utilized During Treatment: Gait belt Activity Tolerance: Patient limited by  pain Patient left: in bed;with call bell/phone within reach;with nursing/sitter in room Nurse Communication: Mobility status PT Visit Diagnosis: Other abnormalities of gait and mobility (R26.89);Pain Pain - Right/Left: Right Pain - part of body: Knee    Time: [id number] PT Time Calculation (min) (ACUTE ONLY): 24 min   Charges:   PT Evaluation $PT Eval Low Complexity: 1 Low          Lou Miner, DPT  Acute Rehabilitation Services  Pager: [phone] Office: [phone]   Rudean Hitt 12/12/2019, 3:18 PM

## 2019-12-12 NOTE — Anesthesia Preprocedure Evaluation (Addendum)
Anesthesia Evaluation  Patient identified by MRN, date of birth, ID band Patient awake    Reviewed: Allergy & Precautions, H&P , NPO status , Patient's Chart, lab work & pertinent test results  Airway Mallampati: II   Neck ROM: full    Dental   Pulmonary Current Smoker,    breath sounds clear to auscultation       Cardiovascular hypertension,  Rhythm:regular Rate:Normal     Neuro/Psych  Headaches, PSYCHIATRIC DISORDERS Anxiety Depression  Neuromuscular disease    GI/Hepatic   Endo/Other    Renal/GU      Musculoskeletal  (+) Arthritis ,   Abdominal   Peds  Hematology   Anesthesia Other Findings   Reproductive/Obstetrics                             Anesthesia Physical Anesthesia Plan  ASA: II  Anesthesia Plan: General   Post-op Pain Management:  Regional for Post-op pain   Induction: Intravenous  PONV Risk Score and Plan: 1 and Ondansetron, Midazolam, Treatment may vary due to age or medical condition and Dexamethasone  Airway Management Planned: LMA  Additional Equipment:   Intra-op Plan:   Post-operative Plan: Extubation in OR  Informed Consent: I have reviewed the patients History and Physical, chart, labs and discussed the procedure including the risks, benefits and alternatives for the proposed anesthesia with the patient or authorized representative who has indicated his/her understanding and acceptance.       Plan Discussed with: CRNA, Anesthesiologist and Surgeon  Anesthesia Plan Comments:        Anesthesia Quick Evaluation

## 2019-12-12 NOTE — Anesthesia Procedure Notes (Signed)
Procedure Name: LMA Insertion Date/Time: 12/12/2019 11:12 AM Performed by: Scheryl Darter, CRNA Pre-anesthesia Checklist: Patient identified, Emergency Drugs available, Suction available and Patient being monitored Patient Re-evaluated:Patient Re-evaluated prior to induction Oxygen Delivery Method: Circle System Utilized Preoxygenation: Pre-oxygenation with 100% oxygen Induction Type: IV induction Ventilation: Mask ventilation without difficulty LMA: LMA inserted LMA Size: 4.0 Number of attempts: 1 Airway Equipment and Method: Bite block Placement Confirmation: positive ETCO2 Tube secured with: Tape Dental Injury: Teeth and Oropharynx as per pre-operative assessment

## 2019-12-12 NOTE — Progress Notes (Signed)
Orthopedic Tech Progress Note Patient Details:  Rita Newton 01/20/1956 [id number] PACU RN said to just apply the BONE FOAM and not the CPM.. shortly after putting patient leg into BONE FOAM Therapy showed up to work with patient Ortho Devices Type of Ortho Device: Bone foam zero knee Ortho Device/Splint Location: RLE Ortho Device/Splint Interventions: Application, Ordered   Post Interventions Patient Tolerated: Well Instructions Provided: Care of device, Adjustment of device   Janit Pagan 12/12/2019, 2:44 PM

## 2019-12-12 NOTE — Discharge Instructions (Signed)

## 2019-12-12 NOTE — Interval H&P Note (Signed)
History and Physical Interval Note:  12/12/2019 9:56 AM  Rita Newton  has presented today for surgery, with the diagnosis of OA RIGHT  KNEE.  The various methods of treatment have been discussed with the patient and family. After consideration of risks, benefits and other options for treatment, the patient has consented to  Procedure(s): TOTAL KNEE ARTHROPLASTY (Right) as a surgical intervention.  The patient's history has been reviewed, patient examined, no change in status, stable for surgery.  I have reviewed the patient's chart and labs.  Questions were answered to the patient's satisfaction.     Yvette Rack

## 2019-12-13 NOTE — Op Note (Signed)
NAME: AZZARIA, OLEAR MEDICAL RECORD [id number] ACCOUNT [id number] DATE OF BIRTH:Dec 24, 1955 FACILITY: [facility] LOCATION: [facility] PHYSICIAN:W. Westlee Devita JR., MD  OPERATIVE REPORT  DATE OF PROCEDURE:  12/12/2019  PREOPERATIVE DIAGNOSIS:  Severe osteoarthritis with varus deformity, right knee.  POSTOPERATIVE DIAGNOSIS:  Severe osteoarthritis with varus deformity, right knee.  OPERATION:  Right total knee replacement (DePuy Attune cemented knee, size 5 femur with 7 mm size 5 tibial bearing, size 6 tibia and 38 mm all poly patella).  SURGEON:  Vangie Bicker, MD  ASSISTANTMarjo Bicker, MD   ANESTHESIA:  General anesthetic.  TOURNIQUET TIME:  60 minutes.  DESCRIPTION OF PROCEDURE:  Straight skin incision with a medial parapatellar approach to the knee made.  We did a 5-degree valgus cut 9 mm on the femur followed by cutting about 2 mm below the most diseased medial compartment.  She has severe varus.  We  did actually cut to the level of the fibula, but again did a minimal tibial cut on the medial side.  Extension gap will eventually be measured to 7 mm.  Sized the femur to be a size 5.  Placement of the all-in-1 cutting block.  Cutting the anterior,  posterior and chamfer cuts.  Tibia was sized to be size 6.  A keel hole was cut for the tibia followed by placement of the tibial baseplate and trialed box cut for the femur.  Femoral trial was placed as well.  The patella was somewhat thin.  We only  took 7.5 mm off the patella.  Again, once the trials were in place we had full extension, resolution of the varus deformity, and no tendency for a spin out.  Good balancing.  Cement was prepared on the back table and inserted the cement.  Tibia followed  by femur and patella allowed the cement harden with the trial bearing in.  The cement was allowed to harden.  Trial bearing was removed.  Tourniquet was released under direct vision.  Prior to this small bits of cement were removed from the  posterior  aspect of the knee and then there was good hemostasis.  No excessive bleeding noted once the tourniquet was released.  Final bearing was placed.  All parameters again deemed to be acceptable.  We did use pulsatile lavage on the bone and subsequent to the  surgery as well as TXA sponge as well as infiltrating the subcutaneous tissues and capsular structures with a mixture of Marcaine and Exparel.  Closure was affected with #1 Ethibond, 2-0 Vicryl and Monocryl in the skin.  Taken to recovery room in stable  condition.  CN/NUANCE  D:12/12/2019 T:12/13/2019 JOB:[id number]

## 2019-12-16 ENCOUNTER — Encounter: Payer: Self-pay | Admitting: *Deleted

## 2019-12-16 NOTE — Anesthesia Postprocedure Evaluation (Signed)
Anesthesia Post Note  Patient: Rita Newton  Procedure(s) Performed: TOTAL KNEE ARTHROPLASTY (Right Knee)     Patient location during evaluation: PACU Anesthesia Type: Regional and General Level of consciousness: awake and alert Pain management: pain level controlled Vital Signs Assessment: post-procedure vital signs reviewed and stable Respiratory status: spontaneous breathing, nonlabored ventilation, respiratory function stable and patient connected to nasal cannula oxygen Cardiovascular status: blood pressure returned to baseline and stable Postop Assessment: no apparent nausea or vomiting Anesthetic complications: no    Last Vitals:  Vitals:   12/12/19 1340 12/12/19 1355  BP: (!) 144/100 109/77  Pulse: 95 80  Resp: 13 12  Temp:  36.4 C  SpO2: 94% 93%    Last Pain:  Vitals:   12/15/19 1832  TempSrc:   PainSc: 0-No pain                 Jenel Gierke S

## 2020-06-09 ENCOUNTER — Ambulatory Visit (HOSPITAL_BASED_OUTPATIENT_CLINIC_OR_DEPARTMENT_OTHER): Admit: 2020-06-09 | Payer: Medicaid Other | Admitting: Orthopedic Surgery

## 2020-06-09 ENCOUNTER — Encounter (HOSPITAL_BASED_OUTPATIENT_CLINIC_OR_DEPARTMENT_OTHER): Payer: Self-pay

## 2020-06-09 SURGERY — ARTHROSCOPY, KNEE, WITH MEDIAL MENISCECTOMY
Anesthesia: Choice | Site: Knee | Laterality: Left

## 2022-08-10 ENCOUNTER — Other Ambulatory Visit: Payer: Self-pay | Admitting: Physician Assistant
